# Patient Record
Sex: Female | Born: 1990
Health system: Southern US, Community
[De-identification: ages and names within clinical notes are randomized; demographics above are authoritative.]

## PROBLEM LIST (undated history)

## (undated) DIAGNOSIS — R51 Headache: Secondary | ICD-10-CM

## (undated) DIAGNOSIS — R519 Headache, unspecified: Secondary | ICD-10-CM

## (undated) DIAGNOSIS — Z789 Other specified health status: Secondary | ICD-10-CM

## (undated) DIAGNOSIS — E079 Disorder of thyroid, unspecified: Secondary | ICD-10-CM

## (undated) HISTORY — DX: Disorder of thyroid, unspecified: E07.9

## (undated) HISTORY — PX: TYMPANOSTOMY TUBE PLACEMENT: SHX32

---

## 2008-01-12 ENCOUNTER — Ambulatory Visit: Payer: Self-pay | Admitting: Internal Medicine

## 2008-01-31 ENCOUNTER — Ambulatory Visit: Payer: Self-pay | Admitting: Neurology

## 2013-03-21 LAB — HM PAP SMEAR: HM PAP: NEGATIVE

## 2013-11-19 ENCOUNTER — Observation Stay: Payer: Self-pay | Admitting: Obstetrics and Gynecology

## 2013-11-23 ENCOUNTER — Inpatient Hospital Stay: Payer: Self-pay

## 2013-11-23 LAB — CBC WITH DIFFERENTIAL/PLATELET
Basophil #: 0.1 10*3/uL (ref 0.0–0.1)
Basophil %: 0.6 %
EOS ABS: 0.1 10*3/uL (ref 0.0–0.7)
EOS PCT: 1 %
HCT: 33.1 % — AB (ref 35.0–47.0)
HGB: 11.1 g/dL — ABNORMAL LOW (ref 12.0–16.0)
LYMPHS ABS: 1.9 10*3/uL (ref 1.0–3.6)
Lymphocyte %: 18.2 %
MCH: 30.4 pg (ref 26.0–34.0)
MCHC: 33.4 g/dL (ref 32.0–36.0)
MCV: 91 fL (ref 80–100)
Monocyte #: 0.9 x10 3/mm (ref 0.2–0.9)
Monocyte %: 8.4 %
NEUTROS ABS: 7.6 10*3/uL — AB (ref 1.4–6.5)
Neutrophil %: 71.8 %
Platelet: 232 10*3/uL (ref 150–440)
RBC: 3.64 10*6/uL — ABNORMAL LOW (ref 3.80–5.20)
RDW: 14.2 % (ref 11.5–14.5)
WBC: 10.6 10*3/uL (ref 3.6–11.0)

## 2013-11-25 LAB — HEMATOCRIT: HCT: 28.5 % — ABNORMAL LOW (ref 35.0–47.0)

## 2014-06-08 ENCOUNTER — Ambulatory Visit
Admit: 2014-06-08 | Disposition: A | Discharge status: Home / Self Care | Payer: Self-pay | Attending: Otolaryngology | Admitting: Otolaryngology

## 2014-06-19 ENCOUNTER — Ambulatory Visit
Admit: 2014-06-19 | Disposition: A | Discharge status: Home / Self Care | Payer: Self-pay | Attending: Otolaryngology | Admitting: Otolaryngology

## 2014-06-27 ENCOUNTER — Other Ambulatory Visit: Payer: Self-pay | Admitting: Otolaryngology

## 2014-06-27 DIAGNOSIS — E041 Nontoxic single thyroid nodule: Secondary | ICD-10-CM

## 2014-06-28 ENCOUNTER — Other Ambulatory Visit: Payer: Self-pay | Admitting: Interventional Radiology

## 2014-06-28 ENCOUNTER — Telehealth: Payer: Self-pay | Admitting: *Deleted

## 2014-06-28 NOTE — Telephone Encounter (Signed)
Left pre-procedure phone call message.  To reschedule time of appt.

## 2014-06-29 ENCOUNTER — Ambulatory Visit
Admission: RE | Admit: 2014-06-29 | Discharge: 2014-06-29 | Disposition: A | Discharge status: Home / Self Care | Payer: 59 | Source: Ambulatory Visit | Attending: Otolaryngology | Admitting: Otolaryngology

## 2014-06-29 DIAGNOSIS — E041 Nontoxic single thyroid nodule: Secondary | ICD-10-CM | POA: Insufficient documentation

## 2014-06-29 NOTE — Discharge Instructions (Signed)
Thyroid Cyst The thyroid gland is a butterfly-shaped gland in the middle of the neck, located just below the voice box. It makes thyroid hormone. Thyroid hormone has an effect on nearly all tissues of your body by regulating your metabolism. Metabolism is the breakdown and use of food that you eat or energy that is stored in your body. Your metabolism affects your heart rate, blood pressure, body temperature, and weight. Thyroid cysts are enlarged fluid filled regions of the thyroid gland. These cysts range in size and may expand and enlarge suddenly. Rapidly expanding cysts may cause pain, difficulty swallowing, and rarely, difficulty breathing. Most cysts of the thyroid are not cancerous (benign). SYMPTOMS Bleeding may occur within the cyst. If the bleeding is severe, the cyst may get larger and produce problems in the neck, including swelling that may produce pain and difficultly swallowing. If the vocal cords are compressed, hoarseness may occur. If the windpipe is compressed, you may have difficulty breathing. DIAGNOSIS  A thyroid cyst is diagnosed through physical exam. The diagnosis can be confirmed by an ultrasound exam of the neck. This creates a picture by bouncing sound waves off the thyroid gland. Sometimes the cysts are drained using a fine needle. The fluid is then sent to the lab where it can be examined. This is done to see if any cells in the fluid are cancerous. If they are found to be cancerous, you will need further treatment.  TREATMENT  If the fluid in your neck does not show evidence of cancer, your caregiver may just want to monitor you with yearly ultrasound exams. Sometimes cysts need to be removed surgically. Document Released: 01/03/2004 Document Revised: 05/04/2011 Document Reviewed: 04/17/2010 [facility] Patient Information 2015 [place], [place]. This information is not intended to replace advice given to you by your health care provider. Make sure you discuss any questions you  have with your health care provider.

## 2014-07-03 NOTE — H&P (Signed)
L&D Evaluation:  History:  HPI 24yo MWF presents at [redacted]w[redacted]d for IOL, normal prenatal course to date.   Patient's Medical History No Chronic Illness   Patient's Surgical History none   Medications Pre Natal Vitamins   Allergies latex   Social History none   Family History Non-Contributory   ROS:  ROS All systems were reviewed.  HEENT, CNS, GI, GU, Respiratory, CV, Renal and Musculoskeletal systems were found to be normal.   Exam:  Vital Signs stable   General no apparent distress   Mental Status clear   Chest clear   Heart normal sinus rhythm   Abdomen gravid, non-tender   Estimated Fetal Weight Average for gestational age   Fetal Position vtx   Edema no edema   Pelvic no external lesions, 1/50/-2   Mebranes Intact   FHT normal rate with no decels   Ucx absent   Skin dry   Impression:  Impression IOL for postterm   Plan:  Plan EFM/NST, monitor contractions and for cervical change, antibiotics for GBBS prophylaxis, cytotec IOL   Electronic Signatures: Evonnie Pat (CNM)  (Signed 01-Oct-15 08:43)  Authored: L&D Evaluation   Last Updated: 01-Oct-15 08:43 by Evonnie Pat (CNM)

## 2014-07-17 ENCOUNTER — Encounter: Payer: Self-pay | Admitting: Otolaryngology

## 2014-07-20 ENCOUNTER — Inpatient Hospital Stay: Admission: RE | Admit: 2014-07-20 | Payer: 59 | Source: Ambulatory Visit

## 2014-07-20 ENCOUNTER — Encounter: Payer: Self-pay | Admitting: *Deleted

## 2014-07-20 DIAGNOSIS — Z91048 Other nonmedicinal substance allergy status: Secondary | ICD-10-CM | POA: Diagnosis not present

## 2014-07-20 DIAGNOSIS — Z79899 Other long term (current) drug therapy: Secondary | ICD-10-CM | POA: Diagnosis not present

## 2014-07-20 DIAGNOSIS — Z91040 Latex allergy status: Secondary | ICD-10-CM | POA: Diagnosis not present

## 2014-07-20 DIAGNOSIS — Z789 Other specified health status: Secondary | ICD-10-CM

## 2014-07-20 DIAGNOSIS — C73 Malignant neoplasm of thyroid gland: Secondary | ICD-10-CM | POA: Diagnosis not present

## 2014-07-20 DIAGNOSIS — E063 Autoimmune thyroiditis: Secondary | ICD-10-CM | POA: Diagnosis not present

## 2014-07-20 DIAGNOSIS — E041 Nontoxic single thyroid nodule: Secondary | ICD-10-CM | POA: Diagnosis present

## 2014-07-20 HISTORY — DX: Other specified health status: Z78.9

## 2014-07-20 NOTE — Patient Instructions (Signed)
  Your procedure is scheduled on: 08-06-14 Report to [facility] Desk 2nd Floor To find out your arrival time please call [phone] between 1PM - 3PM on 08-05-14  Remember: Instructions that are not followed completely may result in serious medical risk, up to and including death, or upon the discretion of your surgeon and anesthesiologist your surgery may need to be rescheduled.    _x___ 1. Do not eat food or drink liquids after midnight. No gum chewing or hard candies.     _x___ 2. No Alcohol for 24 hours before or after surgery.   ____ 3. Bring all medications with you on the day of surgery if instructed.    _x__ 4. Notify your doctor if there is any change in your medical condition     (cold, fever, infections).     Do not wear jewelry, make-up, hairpins, clips or nail polish.  Do not wear lotions, powders, or perfumes. You may wear deodorant.  Do not shave 48 hours prior to surgery. Men may shave face and neck.  Do not bring valuables to the hospital.    [facility] is not responsible for any belongings or valuables.               Contacts, dentures or bridgework may not be worn into surgery.  Leave your suitcase in the car. After surgery it may be brought to your room.  For patients admitted to the hospital, discharge time is determined by your                treatment team.   Patients discharged the day of surgery will not be allowed to drive home.   Please read over the following fact sheets that you were given:     __x__ Take these medicines the morning of surgery with A SIP OF WATER:    1.Reglan  2.   3.   4.  5.  6.  ____ Fleet Enema (as directed)   ____ Use CHG Soap as directed  ____ Use inhalers on the day of surgery  ____ Stop metformin 2 days prior to surgery    ____ Take 1/2 of usual insulin dose the night before surgery and none on the morning of surgery.   ____ Stop Coumadin/Plavix/aspirin N/A  ____ Stop Anti-inflammatories -NO  NSAIDS OR ASA PRODUCTS-TYLENOL OK   ____ Stop supplements until after surgery.    ____ Bring C-Pap to the hospital.

## 2014-08-06 ENCOUNTER — Ambulatory Visit: Payer: 59 | Admitting: Certified Registered Nurse Anesthetist

## 2014-08-06 ENCOUNTER — Encounter: Payer: Self-pay | Admitting: *Deleted

## 2014-08-06 ENCOUNTER — Encounter
Admission: RE | Disposition: A | Discharge status: Home / Self Care | Payer: Self-pay | Source: Ambulatory Visit | Attending: Otolaryngology

## 2014-08-06 ENCOUNTER — Observation Stay
Admission: RE | Admit: 2014-08-06 | Discharge: 2014-08-07 | Disposition: A | Discharge status: Home / Self Care | Payer: 59 | Source: Ambulatory Visit | Attending: Otolaryngology | Admitting: Otolaryngology

## 2014-08-06 DIAGNOSIS — Z91048 Other nonmedicinal substance allergy status: Secondary | ICD-10-CM | POA: Insufficient documentation

## 2014-08-06 DIAGNOSIS — C73 Malignant neoplasm of thyroid gland: Secondary | ICD-10-CM | POA: Diagnosis not present

## 2014-08-06 DIAGNOSIS — E890 Postprocedural hypothyroidism: Secondary | ICD-10-CM

## 2014-08-06 DIAGNOSIS — Z9089 Acquired absence of other organs: Secondary | ICD-10-CM

## 2014-08-06 DIAGNOSIS — Z79899 Other long term (current) drug therapy: Secondary | ICD-10-CM | POA: Insufficient documentation

## 2014-08-06 DIAGNOSIS — Z91040 Latex allergy status: Secondary | ICD-10-CM | POA: Insufficient documentation

## 2014-08-06 DIAGNOSIS — E063 Autoimmune thyroiditis: Secondary | ICD-10-CM | POA: Insufficient documentation

## 2014-08-06 HISTORY — PX: THYROIDECTOMY: SHX17

## 2014-08-06 HISTORY — DX: Headache: R51

## 2014-08-06 HISTORY — DX: Headache, unspecified: R51.9

## 2014-08-06 HISTORY — DX: Other specified health status: Z78.9

## 2014-08-06 LAB — POCT PREGNANCY, URINE: PREG TEST UR: NEGATIVE

## 2014-08-06 LAB — CALCIUM
Calcium: 8.3 mg/dL — ABNORMAL LOW (ref 8.9–10.3)
Calcium: 8.1 mg/dL — ABNORMAL LOW (ref 8.9–10.3)

## 2014-08-06 SURGERY — THYROIDECTOMY
Anesthesia: General

## 2014-08-06 MED ORDER — ONDANSETRON HCL 4 MG/2ML IJ SOLN: 4.0000 mg | Freq: Once | INTRAMUSCULAR | Status: DC | PRN

## 2014-08-06 MED ORDER — ONDANSETRON HCL 4 MG/2ML IJ SOLN
4.0000 mg | Freq: Four times a day (QID) | INTRAMUSCULAR | Status: DC | PRN
  Administered 2014-08-06: 4 mg via INTRAVENOUS
  Filled 2014-08-06 (×2): qty 2

## 2014-08-06 MED ORDER — FENTANYL CITRATE (PF) 100 MCG/2ML IJ SOLN
INTRAMUSCULAR | Status: AC
  Filled 2014-08-06: qty 2

## 2014-08-06 MED ORDER — LACTATED RINGERS IV SOLN
INTRAVENOUS | Status: DC
  Administered 2014-08-06 (×2): via INTRAVENOUS

## 2014-08-06 MED ORDER — CALCIUM CARBONATE ANTACID 500 MG PO CHEW
1000.0000 mg | CHEWABLE_TABLET | Freq: Three times a day (TID) | ORAL | Status: DC
  Administered 2014-08-06 – 2014-08-07 (×2): 1000 mg via ORAL
  Filled 2014-08-06 (×3): qty 5

## 2014-08-06 MED ORDER — BACITRACIN ZINC 500 UNIT/GM EX OINT
TOPICAL_OINTMENT | CUTANEOUS | Status: AC
Start: 2014-08-06 — End: 2014-08-06
  Filled 2014-08-06: qty 28.35

## 2014-08-06 MED ORDER — CALCIUM CARBONATE 1250 (500 CA) MG PO TABS
1000.0000 mg | ORAL_TABLET | Freq: Three times a day (TID) | ORAL | Status: DC
  Filled 2014-08-06 (×3): qty 2

## 2014-08-06 MED ORDER — SODIUM CHLORIDE 0.9 % IR SOLN
Status: DC | PRN
  Administered 2014-08-06: 200 mL

## 2014-08-06 MED ORDER — LIDOCAINE HCL (CARDIAC) 20 MG/ML IV SOLN
INTRAVENOUS | Status: DC | PRN
  Administered 2014-08-06: 100 mg via INTRAVENOUS

## 2014-08-06 MED ORDER — DEXTROSE-NACL 5-0.45 % IV SOLN
INTRAVENOUS | Status: DC
  Administered 2014-08-06: 17:00:00 via INTRAVENOUS

## 2014-08-06 MED ORDER — FAMOTIDINE 20 MG PO TABS
20.0000 mg | ORAL_TABLET | Freq: Once | ORAL | Status: AC
  Administered 2014-08-06: 20 mg via ORAL

## 2014-08-06 MED ORDER — FAMOTIDINE 20 MG PO TABS
ORAL_TABLET | ORAL | Status: AC
  Administered 2014-08-06: 20 mg via ORAL
  Filled 2014-08-06: qty 1

## 2014-08-06 MED ORDER — HYDROCODONE-ACETAMINOPHEN 7.5-325 MG/15ML PO SOLN
10.0000 mL | ORAL | Status: DC | PRN
  Administered 2014-08-06: 20 mL via ORAL
  Filled 2014-08-06: qty 30

## 2014-08-06 MED ORDER — METOCLOPRAMIDE HCL 10 MG PO TABS
10.0000 mg | ORAL_TABLET | Freq: Two times a day (BID) | ORAL | Status: DC
  Administered 2014-08-06: 10 mg via ORAL
  Filled 2014-08-06: qty 1

## 2014-08-06 MED ORDER — BUPIVACAINE-EPINEPHRINE (PF) 0.25% -1:200000 IJ SOLN
INTRAMUSCULAR | Status: DC | PRN
  Administered 2014-08-06: 8 mL

## 2014-08-06 MED ORDER — BUPIVACAINE-EPINEPHRINE (PF) 0.25% -1:200000 IJ SOLN
INTRAMUSCULAR | Status: AC
  Filled 2014-08-06: qty 30

## 2014-08-06 MED ORDER — ONDANSETRON HCL 4 MG/2ML IJ SOLN
INTRAMUSCULAR | Status: DC | PRN
  Administered 2014-08-06: 4 mg via INTRAVENOUS

## 2014-08-06 MED ORDER — SUCCINYLCHOLINE CHLORIDE 20 MG/ML IJ SOLN
INTRAMUSCULAR | Status: DC | PRN
  Administered 2014-08-06: 100 mg via INTRAVENOUS

## 2014-08-06 MED ORDER — CALCIUM CARBONATE ANTACID 500 MG PO CHEW
5.0000 | CHEWABLE_TABLET | Freq: Once | ORAL | Status: AC
  Administered 2014-08-06: 1000 mg via ORAL

## 2014-08-06 MED ORDER — PROPOFOL 10 MG/ML IV BOLUS
INTRAVENOUS | Status: DC | PRN
  Administered 2014-08-06: 140 mg via INTRAVENOUS

## 2014-08-06 MED ORDER — TRAMADOL HCL 50 MG PO TABS
50.0000 mg | ORAL_TABLET | Freq: Four times a day (QID) | ORAL | Status: DC | PRN
  Administered 2014-08-06: 50 mg via ORAL
  Filled 2014-08-06: qty 1

## 2014-08-06 MED ORDER — MORPHINE SULFATE 2 MG/ML IJ SOLN: 1.0000 mg | INTRAMUSCULAR | Status: DC | PRN

## 2014-08-06 MED ORDER — FENTANYL CITRATE (PF) 100 MCG/2ML IJ SOLN
INTRAMUSCULAR | Status: DC | PRN
  Administered 2014-08-06 (×2): 50 ug via INTRAVENOUS
  Administered 2014-08-06: 100 ug via INTRAVENOUS
  Administered 2014-08-06 (×2): 50 ug via INTRAVENOUS

## 2014-08-06 MED ORDER — MENTHOL 3 MG MT LOZG
1.0000 | LOZENGE | OROMUCOSAL | Status: DC | PRN
  Administered 2014-08-07: 3 mg via ORAL
  Filled 2014-08-06: qty 9

## 2014-08-06 MED ORDER — ACETAMINOPHEN 160 MG/5ML PO SOLN
650.0000 mg | Freq: Four times a day (QID) | ORAL | Status: DC | PRN
  Filled 2014-08-06: qty 20.3

## 2014-08-06 MED ORDER — MIDAZOLAM HCL 2 MG/2ML IJ SOLN
INTRAMUSCULAR | Status: DC | PRN
  Administered 2014-08-06: 2 mg via INTRAVENOUS

## 2014-08-06 MED ORDER — TRAMADOL 5 MG/ML ORAL SUSPENSION: 50.0000 mg | Freq: Four times a day (QID) | ORAL | Status: DC | PRN

## 2014-08-06 MED ORDER — FENTANYL CITRATE (PF) 100 MCG/2ML IJ SOLN
25.0000 ug | INTRAMUSCULAR | Status: DC | PRN
  Administered 2014-08-06 (×4): 25 ug via INTRAVENOUS

## 2014-08-06 SURGICAL SUPPLY — 36 items
BLADE SURG 15 STRL LF DISP TIS (BLADE) ×1 IMPLANT
BLADE SURG 15 STRL SS (BLADE) ×1
CANISTER SUCT 1200ML W/VALVE (MISCELLANEOUS) ×2 IMPLANT
CORD BIP STRL DISP 12FT (MISCELLANEOUS) ×2 IMPLANT
DRAIN TLS ROUND 10FR (DRAIN) IMPLANT
DRAPE MAG INST 16X20 L/F (DRAPES) ×2 IMPLANT
DRSG TEGADERM 2-3/8X2-3/4 SM (GAUZE/BANDAGES/DRESSINGS) IMPLANT
ELECT LARYNGEAL 6/7 (MISCELLANEOUS)
ELECT LARYNGEAL 8/9 (MISCELLANEOUS) ×4
ELECTRODE LARYNGEAL 6/7 (MISCELLANEOUS) IMPLANT
ELECTRODE LARYNGEAL 8/9 (MISCELLANEOUS) ×2 IMPLANT
FORCEPS JEWEL BIP 4-3/4 STR (INSTRUMENTS) ×2 IMPLANT
GLOVE BIO SURGEON STRL SZ7.5 (GLOVE) IMPLANT
GLOVE SURG NONLX 6.5 ULT (GLOVE) ×8 IMPLANT
GOWN STRL REUS W/ TWL LRG LVL3 (GOWN DISPOSABLE) ×3 IMPLANT
GOWN STRL REUS W/TWL LRG LVL3 (GOWN DISPOSABLE) ×3
HARMONIC SCALPEL FOCUS (MISCELLANEOUS) ×2 IMPLANT
HEMOSTAT SURGICEL 2X3 (HEMOSTASIS) ×2 IMPLANT
HOOK STAY 5M SHARP BLUNT 3316- (MISCELLANEOUS) ×2 IMPLANT
KIT RM TURNOVER STRD PROC AR (KITS) ×2 IMPLANT
LABEL OR SOLS (LABEL) ×2 IMPLANT
LIQUID BAND (GAUZE/BANDAGES/DRESSINGS) ×2 IMPLANT
NS IRRIG 500ML POUR BTL (IV SOLUTION) ×2 IMPLANT
PACK HEAD/NECK (MISCELLANEOUS) ×2 IMPLANT
PAD GROUND ADULT SPLIT (MISCELLANEOUS) ×2 IMPLANT
PROBE NEUROSIGN BIPOL (MISCELLANEOUS) ×1 IMPLANT
PROBE NEUROSIGN BIPOLAR (MISCELLANEOUS) ×1
SPONGE KITTNER 5P (MISCELLANEOUS) ×4 IMPLANT
SPONGE XRAY 4X4 16PLY STRL (MISCELLANEOUS) ×6 IMPLANT
STRIP CLOSURE SKIN 1/4X4 (GAUZE/BANDAGES/DRESSINGS) ×2 IMPLANT
SUT PROLENE 6 0 P 1 18 (SUTURE) ×2 IMPLANT
SUT SILK 2 0 (SUTURE) ×1
SUT SILK 2 0 SH (SUTURE) ×2 IMPLANT
SUT SILK 2-0 18XBRD TIE 12 (SUTURE) ×1 IMPLANT
SUT VIC AB 4-0 RB1 18 (SUTURE) ×2 IMPLANT
SYSTEM CHEST DRAIN TLS 7FR (DRAIN) IMPLANT

## 2014-08-06 NOTE — Transfer of Care (Signed)
Immediate Anesthesia Transfer of Care Note  Patient: Kristen Powers  Procedure(s) Performed: Procedure(s): THYROIDECTOMY (N/A)  Patient Location: PACU  Anesthesia Type:General  Level of Consciousness: awake and alert   Airway & Oxygen Therapy: Patient Spontanous Breathing and Patient connected to face mask oxygen  Post-op Assessment: Report given to RN and Post -op Vital signs reviewed and stable  Post vital signs: Reviewed and stable  Last Vitals:  Filed Vitals:   08/06/14 1020  BP: 141/83  Pulse: 109  Temp: 37.8 C  Resp: 18    Complications: No apparent anesthesia complications

## 2014-08-06 NOTE — Op Note (Signed)
..08/06/2014  10:14 AM    Kristen Powers  [id number]   Pre-Op Dx:  Thyroid Nodules suspicious on FNA  Post-op Dx: same  Proc: Minimally Invasive Total Thyroidectomy with Laryngeal Nerve Monitoring   Surg:  Kristen Powers   Assistant:  Kristen Powers  Anes:  GOT  EBL:  25 cc  Comp:  none  Findings:  Left superior and inferior parathyroid glands identified and preserved, right superior parathyroid identified and preserved.  Bilateral Recurrent laryngeal nerves identified and preserved.  Multinodular thyroid with multiple lymph nodes removed from anterior neck and right neck lateral to recurrent laryngeal nerve  Procedure: After the patient was identified in holding and the consent and H&P was reviewed, the patient was brought to the operating room and place in a supine position.  At this time, the patient was marked along a natural skin crease in the lower neck after general endotracheal anesthesia was induced.  Visualization of the nerve monitoring electrode on the endotracheal tube was made.  The patient at this time was injected with 8ccs of 0.25% Marcaine with 1:200,000 epinephrine.  At this time, the patient was prepped and draped in a sterile fashion.  An anterior neck incision with a 15 blade was made and dissection through the subcutaneous tissues was preformed with Bovie electrocautery.  The platysma was divided and the median raphe was sharply and bluntly dissected until the anterior isthmus of the thyroid was encountered.  At this time, the median raphe was opened until the sternal notch to the thyroid notch.  Anterior neck veins were divided with Harmonic Scalpel.    Attention at this time was directed to the patient's left thyroid lobe.  The sternohyoid and sternothyroid muscles were separated from the left thyroid lobe until the carotid artery and sheath were encountered.  The lateral border of the thyroid was bluntly and sharply dissected inferior and superior.   This demonstrated multiple lymph nodes medially in the area of the pyramidal lobe.  The lateral border of the superior thyroid vessels were identified and pedicled.  Medially between the larynx and the superior thyroid lobe, Kristen Powers space as bluntly entered resulting in a pedicled superior thyroid vessel bundle.  This was ligated  With Harmonic scalpel.  At this time, the superior attachments were divided and the thyroid lobe was able to be brought out of the wound and Berry's ligament was pedicled.  Careful dissection revealed the superior and inferior parathyroid glands as well as the recurrent laryngeal nerve coursing deep to the inferior thyroid artery.  This was identified and protected and the remaining attachments of the left hemithyroid were divided.  Attention at this time was directed to the patient's right thyroid lobe.  The sternohyoid and sternothyroid muscles were separated from the right thyroid lobe until the carotid artery and sheath were encountered.  The lateral border of the thyroid was bluntly and sharply dissected inferior and superior.  This demonstrated multiple lymph nodes medially in the area of the pyramidal lobe.  The lateral border of the superior thyroid vessels were identified and pedicled.  Medially between the larynx and the superior thyroid lobe, Kristen Powers space as bluntly entered resulting in a pedicled superior thyroid vessel bundle.  This was ligated  With Harmonic scalpel.  At this time, the superior attachments were divided and the thyroid lobe was able to be brought out of the wound and Berry's ligament was pedicled.  Careful dissection revealed the superior and inferior parathyroid glands as well as the recurrent laryngeal nerve coursing  deep to the inferior thyroid artery.  This was identified and protected and the remaining attachments of the right hemithyroid were divided.  At this time, all remaining attachments of the thyroid gland to the anterior trachea were divided.   Both recurrent laryngeal nerves were noted to be intact and stimulated well.  The left parathyroid glands were both evaluated and noted to be good in color.  The right superior parathyroid gland was identified and noted to be of normal color.  No interior parathyroid gland was noted in the specimen.  Several small lymph nodes were now removed from the patient's anterior neck.  Two 2 cm lymph nodes were removed from anterior trachea superior to the thyroid isthmus.  One 1.5cm lymph node was removed lateral to the right recurrent laryngeal nerve.  Dispo:   To PACU in good condition  Plan:  Admit to floor.  Follow calcium levels.  Hold on synthroid until pathology back.  Kristen Powers  08/06/2014 10:14 AM

## 2014-08-06 NOTE — H&P (Signed)
..  History and Physical paper copy reviewed and updated date of procedure and will be scanned into system.  

## 2014-08-06 NOTE — Anesthesia Preprocedure Evaluation (Signed)
Anesthesia Evaluation  Patient identified by MRN, date of birth, ID band Patient awake    Reviewed: Allergy & Precautions, NPO status   Airway Mallampati: I  TM Distance: >3 FB Neck ROM: Full    Dental  (+) Teeth Intact   Pulmonary    Pulmonary exam normal       Cardiovascular Exercise Tolerance: Good Normal cardiovascular exam    Neuro/Psych    GI/Hepatic   Endo/Other    Renal/GU      Musculoskeletal   Abdominal Normal abdominal exam  (+)   Peds  Hematology   Anesthesia Other Findings   Reproductive/Obstetrics (+) Breast feeding                              Anesthesia Physical Anesthesia Plan  ASA: II  Anesthesia Plan: General   Post-op Pain Management:    Induction: Intravenous  Airway Management Planned: Oral ETT  Additional Equipment:   Intra-op Plan:   Post-operative Plan: Extubation in OR  Informed Consent: I have reviewed the patients History and Physical, chart, labs and discussed the procedure including the risks, benefits and alternatives for the proposed anesthesia with the patient or authorized representative who has indicated his/her understanding and acceptance.     Plan Discussed with: CRNA  Anesthesia Plan Comments:         Anesthesia Quick Evaluation

## 2014-08-06 NOTE — Care Management Note (Signed)
Case Management Note  Patient Details  Name: TARRI GUILFOIL MRN: [id number] Date of Birth: 02-10-1991  Subjective/Objective:    24yo Ms Talita Recht was admitted today and received a total Thyroidectomy today. Case management will follow for discharge planning but do not anticipate any home health needs.                 Action/Plan:   Expected Discharge Date:                  Expected Discharge Plan:     In-House Referral:     Discharge planning Services     Post Acute Care Choice:    Choice offered to:     DME Arranged:    DME Agency:     HH Arranged:    [place] Agency:     Status of Service:     Medicare Important Message Given:    Date Medicare IM Given:    Medicare IM give by:    Date Additional Medicare IM Given:    Additional Medicare Important Message give by:     If discussed at [place] of Stay Meetings, dates discussed:    Additional Comments:  Blayne Garlick A, RN 08/06/2014, 3:55 PM

## 2014-08-06 NOTE — Anesthesia Postprocedure Evaluation (Signed)
  Anesthesia Post-op Note  Patient: Kristen Powers  Procedure(s) Performed: Procedure(s): THYROIDECTOMY (N/A)  Anesthesia type:General  Patient location: PACU  Post pain: Pain level controlled  Post assessment: Post-op Vital signs reviewed, Patient's Cardiovascular Status Stable, Respiratory Function Stable, Patent Airway and No signs of Nausea or vomiting  Post vital signs: Reviewed and stable  Last Vitals:  Filed Vitals:   08/06/14 1037  BP:   Pulse:   Temp: 38.1 C  Resp:     Level of consciousness: awake, alert  and patient cooperative  Complications: No apparent anesthesia complications

## 2014-08-07 DIAGNOSIS — C73 Malignant neoplasm of thyroid gland: Secondary | ICD-10-CM | POA: Diagnosis not present

## 2014-08-07 LAB — CALCIUM: Calcium: 8.2 mg/dL — ABNORMAL LOW (ref 8.9–10.3)

## 2014-08-07 MED ORDER — BACITRACIN 500 UNIT/GM EX OINT: 1.0000 "application " | TOPICAL_OINTMENT | Freq: Three times a day (TID) | CUTANEOUS | Status: DC

## 2014-08-07 MED ORDER — MENTHOL 3 MG MT LOZG: 1.0000 | LOZENGE | OROMUCOSAL | Status: DC | PRN

## 2014-08-07 MED ORDER — TRAMADOL HCL 50 MG PO TABS: 100.0000 mg | ORAL_TABLET | Freq: Four times a day (QID) | ORAL | Status: DC | PRN

## 2014-08-07 MED ORDER — PROMETHAZINE HCL 12.5 MG PO TABS: 12.5000 mg | ORAL_TABLET | Freq: Four times a day (QID) | ORAL | Status: DC | PRN

## 2014-08-07 MED ORDER — CALCIUM CARBONATE ANTACID 500 MG PO CHEW: 1000.0000 mg | CHEWABLE_TABLET | Freq: Three times a day (TID) | ORAL | Status: DC

## 2014-08-07 NOTE — Progress Notes (Signed)
Pt d/c to home today.  Husband at bedside for transport.  IV removed intact.  Pt d/c instructions reviewed and all questions and concerns addressed.  Rx printed and given to pt.  Medication and Procedural Education printed on D/C paperwork.  All questions and concerns regarding medications and d/c instructions reviewed.  Pt states understanding.

## 2014-08-07 NOTE — Progress Notes (Signed)
..   08/07/2014 7:33 AM  Lyla Son, Marye Round [id number]  Post-Op Day 1    Temp:  [97.3 F (36.3 C)-100.6 F (38.1 C)] 98.5 F (36.9 C) (06/14 0441) Pulse Rate:  [72-109] 76 (06/14 0441) Resp:  [16-18] 17 (06/14 0441) BP: (110-141)/(61-85) 110/61 mmHg (06/14 0441) SpO2:  [97 %-100 %] 99 % (06/14 0441),     Intake/Output Summary (Last 24 hours) at 08/07/14 0733 Last data filed at 08/07/14 4193  Gross per 24 hour  Intake   2323 ml  Output   1825 ml  Net    498 ml    Results for orders placed or performed during the hospital encounter of 08/06/14 (from the past 24 hour(s))  Calcium     Status: Abnormal   Collection Time: 08/06/14 11:19 AM  Result Value Ref Range   Calcium 8.3 (L) 8.9 - 10.3 mg/dL  Calcium     Status: Abnormal   Collection Time: 08/06/14  4:02 PM  Result Value Ref Range   Calcium 8.1 (L) 8.9 - 10.3 mg/dL  Calcium     Status: Abnormal   Collection Time: 08/07/14  4:27 AM  Result Value Ref Range   Calcium 8.2 (L) 8.9 - 10.3 mg/dL    SUBJECTIVE:  No acute events.  Some nausea yesterday with pain medications.  Switched to tramadol and improved nausea.  Still some pain with solids, but drinking fluids.  Ambulating to BR.  OBJECTIVE:  Gen- NAD, alert and responsive. Neck- incision c/d/i with minimal edema, no hematoma or seroma Neuro- neg chovsteks  IMPRESSION:  S/p Total thyroidectomy POD#1  PLAN:  Calciums increased this morning from yesterday afternoon.  Will discharge home on oral calcium supplementation.  Hold on synthroid until pathology back.  Isatu Macinnes 08/07/2014, 7:33 AM

## 2014-08-07 NOTE — Care Management (Signed)
Spoke with patient's husband. They deny need for RNCM. They both anticipate discharge today.

## 2014-08-08 LAB — SURGICAL PATHOLOGY

## 2014-08-21 ENCOUNTER — Ambulatory Visit: Payer: 59 | Admitting: Oncology

## 2014-08-22 ENCOUNTER — Inpatient Hospital Stay: Payer: 59 | Attending: Oncology | Admitting: Oncology

## 2014-08-22 ENCOUNTER — Encounter: Payer: Self-pay | Admitting: Oncology

## 2014-08-22 ENCOUNTER — Inpatient Hospital Stay: Payer: 59

## 2014-08-22 VITALS — BP 127/77 | HR 91 | Temp 97.5°F | Wt 180.0 lb

## 2014-08-22 DIAGNOSIS — C73 Malignant neoplasm of thyroid gland: Secondary | ICD-10-CM | POA: Diagnosis not present

## 2014-08-22 DIAGNOSIS — Z808 Family history of malignant neoplasm of other organs or systems: Secondary | ICD-10-CM | POA: Diagnosis not present

## 2014-08-22 DIAGNOSIS — Z79899 Other long term (current) drug therapy: Secondary | ICD-10-CM | POA: Diagnosis not present

## 2014-08-22 LAB — COMPREHENSIVE METABOLIC PANEL
ALBUMIN: 4.4 g/dL (ref 3.5–5.0)
ALK PHOS: 70 U/L (ref 38–126)
ALT: 11 U/L — AB (ref 14–54)
AST: 18 U/L (ref 15–41)
Anion gap: 5 (ref 5–15)
BILIRUBIN TOTAL: 0.5 mg/dL (ref 0.3–1.2)
BUN: 15 mg/dL (ref 6–20)
CO2: 26 mmol/L (ref 22–32)
Calcium: 8.6 mg/dL — ABNORMAL LOW (ref 8.9–10.3)
Chloride: 103 mmol/L (ref 101–111)
Creatinine, Ser: 0.82 mg/dL (ref 0.44–1.00)
GFR calc Af Amer: >60 mL/min (ref >60)
GFR calc non Af Amer: >60 mL/min (ref >60)
GLUCOSE: 82 mg/dL (ref 65–99)
POTASSIUM: 3.9 mmol/L (ref 3.5–5.1)
SODIUM: 134 mmol/L — AB (ref 135–145)
Total Protein: 7.3 g/dL (ref 6.5–8.1)

## 2014-08-22 LAB — CBC WITH DIFFERENTIAL/PLATELET
Basophils Absolute: 0.1 10*3/uL (ref 0–0.1)
Basophils Relative: 1 %
EOS ABS: 0.2 10*3/uL (ref 0–0.7)
Eosinophils Relative: 2 %
HEMATOCRIT: 41.8 % (ref 35.0–47.0)
Hemoglobin: 14.2 g/dL (ref 12.0–16.0)
LYMPHS PCT: 37 %
Lymphs Abs: 2.9 10*3/uL (ref 1.0–3.6)
MCH: 31.3 pg (ref 26.0–34.0)
MCHC: 33.9 g/dL (ref 32.0–36.0)
MCV: 92.5 fL (ref 80.0–100.0)
Monocytes Absolute: 0.7 10*3/uL (ref 0.2–0.9)
Monocytes Relative: 9 %
Neutro Abs: 4.1 10*3/uL (ref 1.4–6.5)
Neutrophils Relative %: 51 %
PLATELETS: 294 10*3/uL (ref 150–440)
RBC: 4.52 MIL/uL (ref 3.80–5.20)
RDW: 12.3 % (ref 11.5–14.5)
WBC: 8 10*3/uL (ref 3.6–11.0)

## 2014-08-22 LAB — TSH: TSH: 17.46 u[IU]/mL — ABNORMAL HIGH (ref 0.350–4.500)

## 2014-08-22 MED ORDER — LEVOTHYROXINE SODIUM 25 MCG PO TABS
25.0000 ug | ORAL_TABLET | Freq: Every day | ORAL | Status: DC
Start: 2014-08-22 — End: 2014-09-19

## 2014-08-22 NOTE — Progress Notes (Signed)
[place] @ [facility] [phone]  [phone]   [place] OB: 06-25-1990  MR#: [id number]  JAS#:[id number]  Patient Care Team: Evonnie Pat, CNM as PCP - General (Obstetrics and Gynecology)  CHIEF COMPLAINT:  Chief Complaint  Patient presents with  . New Evaluation    VISIT DIAGNOSIS:     ICD-9-CM ICD-10-CM   1. Thyroid cancer 193 C73 CBC with Differential     Comprehensive metabolic panel     T4     TSH     Thyroglobulin antibody     levothyroxine (SYNTHROID, LEVOTHROID) 25 MCG tablet     T4     TSH     T4     TSH     Thyroglobulin Level     CANCELED: Thyroglobulin antibody  2. Cancer of thyroid 193 C73      Oncology History   1.  Total thyroidectomy on August 06, 2014 for carcinoma of thyroid PE thyroid cancer with adjacent FNA site change.  Marked lymphocytic thyroiditis with nodular hyperplasia.  Benign reactive lymph node 3 lymph nodes are negative for malignancy.  Tumor size is 2.43 cm pT2 pN0 m0 tumor.     Cancer of thyroid   08/06/2014 Initial Diagnosis Cancer of thyroid    Oncology Flowsheet 08/06/2014 08/06/2014  metoCLOPramide (REGLAN) PO 10 mg    ondansetron (ZOFRAN) IV - 4 mg    INTERVAL HISTORY:   24 year old lady was found to have a large thyroid gland.  Patient underwent initial needle biopsy followed by afirma testing which was suspicious of patient underwent total thyroidectomy.  Was found to have papillary thyroid cancer.  Multiple lymph nodes which were removed were negative.  Total size of 2.43 cm.  Tumor was confined to the thyroid gland.  Was papillary thyroid cancer.  In for vascular invasion was not noticed.  He should not was referred to me for further evaluation and treatment consideration Patient has 6 months old baby and desires to go breast-feeding at least till  October. Patient desires to have another baby in next 2 years. Remains asymptomatic at present time   REVIEW OF SYSTEMS:     GENERAL:  Feels good.  Active.  No fevers, sweats or weight loss. PERFORMANCE STATUS (ECOG):  00 HEENT:  No visual changes, runny nose, sore throat, mouth sores or tenderness. Lungs: No shortness of breath or cough.  No hemoptysis. Cardiac:  No chest pain, palpitations, orthopnea, or PND. GI:  No nausea, vomiting, diarrhea, constipation, melena or hematochezia. GU:  No urgency, frequency, dysuria, or hematuria. Musculoskeletal:  No back pain.  No joint pain.  No muscle tenderness. Extremities:  No pain or swelling. Skin:  No rashes or skin changes. Neuro:  No headache, numbness or weakness, balance or coordination issues. Endocrine:  No diabetes, thyroid issues, hot flashes or night sweats. Psych:  No mood changes, depression or anxiety. Pain:  No focal pain. Review of systems:  All other systems reviewed and found to be negative.  As per HPI. Otherwise, a complete review of systems is negatve.  PAST MEDICAL HISTORY: Past Medical History  Diagnosis Date  . Headache   . Breastfeeding (infant) Jul 20, 2014    PAST SURGICAL HISTORY: Past Surgical History  Procedure Laterality Date  . Tympanostomy tube placement    . Thyroidectomy N/A 08/06/2014    Procedure: THYROIDECTOMY;  Surgeon: Carloyn Manner, MD;  Location: [facility] ORS;  Service: ENT;  Laterality: N/A;    FAMILY HISTORY  Mother had papillary thyroid cancer GYNECOLOGIC HISTORY: Premenopausal    ADVANCED DIRECTIVES: No advanced healthcare directive   HEALTH MAINTENANCE: History  Substance Use Topics  . Smoking status: Never Smoker   . Smokeless tobacco: Not on file  . Alcohol Use: No    panel:  Allergies  Allergen Reactions  . Latex Swelling and Rash  . Topamax [Topiramate]     Current Outpatient Prescriptions  Medication Sig Dispense Refill  . bacitracin 500 UNIT/GM ointment Apply 1 application topically 3 (three) times daily. 15 g 0  . calcium carbonate (TUMS - DOSED IN MG ELEMENTAL CALCIUM) 500 MG  chewable tablet Chew 5 tablets (1,000 mg of elemental calcium total) by mouth 3 (three) times daily with meals. 200 tablet 1  . metoCLOPramide (REGLAN) 10 MG tablet Take 10 mg by mouth 2 (two) times daily.    Marland Kitchen levothyroxine (SYNTHROID, LEVOTHROID) 25 MCG tablet Take 1 tablet (25 mcg total) by mouth daily before breakfast. 30 tablet 6  . menthol-cetylpyridinium (CEPACOL) 3 MG lozenge Take 1 lozenge (3 mg total) by mouth as needed for sore throat. (Patient not taking: Reported on 08/22/2014) 100 tablet 12  . promethazine (PHENERGAN) 12.5 MG tablet Take 1 tablet (12.5 mg total) by mouth every 6 (six) hours as needed for nausea or vomiting. (Patient not taking: Reported on 08/22/2014) 30 tablet 0  . traMADol (ULTRAM) 50 MG tablet Take 2 tablets (100 mg total) by mouth every 6 (six) hours as needed for moderate pain. (Patient not taking: Reported on 08/22/2014) 40 tablet 0   No current facility-administered medications for this visit.    OBJECTIVE: PHYSICAL EXAM: GENERAL:  Well developed, well nourished, sitting comfortably in the exam room in no acute distress. MENTAL STATUS:  Alert and oriented to person, place and time. HEAD:    Normocephalic, atraumatic, face symmetric, no Cushingoid features. Thyroid area: Recent surgery wound is healing well there is in duration and mild redness.  No palpable lymphadenopathy. EYES:.  Pupils equal round and reactive to light and accomodation.  No conjunctivitis or scleral icterus. ENT:  Oropharynx clear without lesion.  Tongue normal. Mucous membranes moist.  RESPIRATORY:  Clear to auscultation without rales, wheezes or rhonchi. CARDIOVASCULAR:  Regular rate and rhythm without murmur, rub or gallop. BREAST: Not examined . ABDOMEN:  Soft, non-tender, with active bowel sounds, and no hepatosplenomegaly.  No masses. BACK:  No CVA tenderness.  No tenderness on percussion of the back or rib cage. SKIN:  No rashes, ulcers or lesions. EXTREMITIES: No edema, no skin  discoloration or tenderness.  No palpable cords. LYMPH NODES: No palpable cervical, supraclavicular, axillary or inguinal adenopathy  NEUROLOGICAL: Unremarkable. PSYCH:  Appropriate.  Filed Vitals:   08/22/14 1354  BP: 127/77  Pulse: 91  Temp: 97.5 F (36.4 C)     Body mass index is 29.07 kg/(m^2).    ECOG FS:0 - Asymptomatic  LAB RESULTS:  Appointment on 08/22/2014  Component Date Value Ref Range Status  . WBC 08/22/2014 8.0  3.6 - 11.0 K/uL Final  . RBC 08/22/2014 4.52  3.80 - 5.20 MIL/uL Final  . Hemoglobin 08/22/2014 14.2  12.0 - 16.0 g/dL Final  . HCT 08/22/2014 41.8  35.0 - 47.0 % Final  . MCV 08/22/2014 92.5  80.0 - 100.0 fL Final  . MCH 08/22/2014 31.3  26.0 - 34.0 pg Final  . MCHC 08/22/2014 33.9  32.0 - 36.0 g/dL Final  . RDW 08/22/2014 12.3  11.5 - 14.5 % Final  . Platelets 08/22/2014  294  150 - 440 K/uL Final  . Neutrophils Relative % 08/22/2014 51   Final  . Neutro Abs 08/22/2014 4.1  1.4 - 6.5 K/uL Final  . Lymphocytes Relative 08/22/2014 37   Final  . Lymphs Abs 08/22/2014 2.9  1.0 - 3.6 K/uL Final  . Monocytes Relative 08/22/2014 9   Final  . Monocytes Absolute 08/22/2014 0.7  0.2 - 0.9 K/uL Final  . Eosinophils Relative 08/22/2014 2   Final  . Eosinophils Absolute 08/22/2014 0.2  0 - 0.7 K/uL Final  . Basophils Relative 08/22/2014 1   Final  . Basophils Absolute 08/22/2014 0.1  0 - 0.1 K/uL Final  . Sodium 08/22/2014 134* 135 - 145 mmol/L Final  . Potassium 08/22/2014 3.9  3.5 - 5.1 mmol/L Final  . Chloride 08/22/2014 103  101 - 111 mmol/L Final  . CO2 08/22/2014 26  22 - 32 mmol/L Final  . Glucose, Bld 08/22/2014 82  65 - 99 mg/dL Final  . BUN 08/22/2014 15  6 - 20 mg/dL Final  . Creatinine, Ser 08/22/2014 0.82  0.44 - 1.00 mg/dL Final  . Calcium 08/22/2014 8.6* 8.9 - 10.3 mg/dL Final  . Total Protein 08/22/2014 7.3  6.5 - 8.1 g/dL Final  . Albumin 08/22/2014 4.4  3.5 - 5.0 g/dL Final  . AST 08/22/2014 18  15 - 41 U/L Final  . ALT 08/22/2014 11* 14 -  54 U/L Final  . Alkaline Phosphatase 08/22/2014 70  38 - 126 U/L Final  . Total Bilirubin 08/22/2014 0.5  0.3 - 1.2 mg/dL Final  . GFR calc non Af Amer 08/22/2014 >60  >60 mL/min Final  . GFR calc Af Amer 08/22/2014 >60  >60 mL/min Final   Comment: (NOTE) The eGFR has been calculated using the CKD EPI equation. This calculation has not been validated in all clinical situations. eGFR's persistently <60 mL/min signify possible Chronic Kidney Disease.   . Anion gap 08/22/2014 5  5 - 15 Final     Ultrasound of the thyroid has been reviewed. ASSESSMENT:  Carcinoma of thyroid involving is Marcello Moores and right inferior lobe. Stage I disease T2 N0 M0 tumor Papillary thyroid carcinoma 2.  Patient has 65 months old child and she is still breast-feeding.  PLAN  I had prolonged discussion with patient.  Patient case was also reviewed in tumor conference. NCC and guidelines would suggest that in a younger patient with less than 4 cm tumor we can hold off radioactive ablation therapy Considering that patient desires to continue to breast-feed her child at this point in time fertility issue had been discussed with the patient.  Long-term side effect of radioactive iodine therapy.  We will consider switching patient to Synthroid monitoring thyroglobulin Possibility of radioactive iodine therapy after October Those issues that been discussed at length. The patient also has family history of thyroid cancer with mother having papillary thyroid cancer and possibility of genetic mutation involved will be also evaluated Recheck T4 TSH in 4 weeks after starting Synthroid Reevaluate patient in October for consideration of radioactive iodine ablation therapy  Patient expressed understanding and was in agreement with this plan. She also understands that She can call clinic at any time with any questions, concerns, or complaints.    Cancer of thyroid   Staging form: Thyroid - Papillary or Follicular (Under 45  years), AJCC 7th Edition     Clinical: Stage I (T2, N0, M0) - Signed by Forest Gleason, MD on 08/22/2014   Forest Gleason, MD  08/22/2014 5:02 PM

## 2014-08-22 NOTE — Progress Notes (Signed)
Patient her as referral from Dr. Pryor Ochoa for papillary thyroid cancer.  Total Thyroidectomy July 13. Patient does not have living will.  Never smoked.

## 2014-08-23 LAB — T4: T4 TOTAL: 2 ug/dL — AB (ref 4.5–12.0)

## 2014-08-23 LAB — THYROGLOBULIN ANTIBODY: THYROGLOBULIN ANTIBODY: 3.5 [IU]/mL — AB (ref 0.0–0.9)

## 2014-09-18 ENCOUNTER — Inpatient Hospital Stay: Payer: BLUE CROSS/BLUE SHIELD | Attending: Oncology

## 2014-09-18 DIAGNOSIS — C73 Malignant neoplasm of thyroid gland: Secondary | ICD-10-CM | POA: Diagnosis present

## 2014-09-18 LAB — TSH: TSH: 67.64 u[IU]/mL — AB (ref 0.350–4.500)

## 2014-09-19 ENCOUNTER — Other Ambulatory Visit: Payer: Self-pay | Admitting: *Deleted

## 2014-09-19 ENCOUNTER — Telehealth: Payer: Self-pay | Admitting: *Deleted

## 2014-09-19 DIAGNOSIS — C73 Malignant neoplasm of thyroid gland: Secondary | ICD-10-CM

## 2014-09-19 LAB — T4: T4, Total: 7.1 ug/dL (ref 4.5–12.0)

## 2014-09-19 MED ORDER — LEVOTHYROXINE SODIUM 75 MCG PO TABS: 75.0000 ug | ORAL_TABLET | Freq: Every day | ORAL | Status: DC

## 2014-09-19 NOTE — Telephone Encounter (Signed)
TSH is elevated and synthroid needs to be increased to 97mcg. New Rx has been escribed to pharmacy. Will recheck labs on 10/09/14.

## 2014-09-19 NOTE — Telephone Encounter (Signed)
Called and left message that TSH is elevated and MD wants pt to increase synthroid to 75 mcg.  A new prescription has been e-scribed to patient's pharmacy. Patient can call back if she has further questions or concerns.

## 2014-10-23 ENCOUNTER — Other Ambulatory Visit: Payer: BLUE CROSS/BLUE SHIELD

## 2014-10-24 ENCOUNTER — Other Ambulatory Visit: Payer: Self-pay

## 2014-10-25 LAB — TSH: TSH: 5.21 u[IU]/mL — ABNORMAL HIGH (ref 0.450–4.500)

## 2014-10-25 LAB — CALCIUM: CALCIUM: 9.9 mg/dL (ref 8.7–10.2)

## 2014-11-05 ENCOUNTER — Other Ambulatory Visit: Payer: Self-pay | Admitting: Obstetrics and Gynecology

## 2014-11-05 ENCOUNTER — Other Ambulatory Visit: Payer: Self-pay | Admitting: *Deleted

## 2014-11-05 MED ORDER — NITROFURANTOIN MONOHYD MACRO 100 MG PO CAPS: 100.0000 mg | ORAL_CAPSULE | Freq: Two times a day (BID) | ORAL | Status: DC

## 2014-11-06 ENCOUNTER — Telehealth: Payer: Self-pay | Admitting: *Deleted

## 2014-11-06 DIAGNOSIS — C73 Malignant neoplasm of thyroid gland: Secondary | ICD-10-CM

## 2014-11-06 MED ORDER — LEVOTHYROXINE SODIUM 150 MCG PO TABS: 150.0000 ug | ORAL_TABLET | Freq: Every day | ORAL | Status: DC

## 2014-11-06 NOTE — Telephone Encounter (Signed)
Her TSH is abn so he is adjusting her synthroid dose to 150 mcg daily. Wanted to update you

## 2014-11-09 LAB — URINE CULTURE

## 2014-11-16 ENCOUNTER — Other Ambulatory Visit: Payer: Self-pay | Admitting: Obstetrics and Gynecology

## 2014-12-11 ENCOUNTER — Inpatient Hospital Stay: Payer: BLUE CROSS/BLUE SHIELD | Attending: Oncology | Admitting: Oncology

## 2014-12-11 ENCOUNTER — Inpatient Hospital Stay: Payer: BLUE CROSS/BLUE SHIELD

## 2014-12-11 ENCOUNTER — Encounter: Payer: Self-pay | Admitting: Oncology

## 2014-12-11 VITALS — BP 141/80 | HR 83 | Temp 97.7°F | Wt 189.6 lb

## 2014-12-11 DIAGNOSIS — C73 Malignant neoplasm of thyroid gland: Secondary | ICD-10-CM | POA: Insufficient documentation

## 2014-12-11 DIAGNOSIS — Z79899 Other long term (current) drug therapy: Secondary | ICD-10-CM

## 2014-12-11 LAB — TSH: TSH: 0.095 u[IU]/mL — AB (ref 0.350–4.500)

## 2014-12-11 MED ORDER — LEVOTHYROXINE SODIUM 150 MCG PO TABS: 150.0000 ug | ORAL_TABLET | Freq: Every day | ORAL | Status: DC

## 2014-12-12 LAB — T4: T4, Total: 11 ug/dL (ref 4.5–12.0)

## 2014-12-13 ENCOUNTER — Other Ambulatory Visit: Payer: Self-pay | Admitting: Oncology

## 2014-12-13 ENCOUNTER — Other Ambulatory Visit: Payer: Self-pay | Admitting: *Deleted

## 2014-12-13 DIAGNOSIS — C73 Malignant neoplasm of thyroid gland: Secondary | ICD-10-CM

## 2014-12-14 ENCOUNTER — Other Ambulatory Visit: Payer: Self-pay | Admitting: *Deleted

## 2014-12-14 DIAGNOSIS — C73 Malignant neoplasm of thyroid gland: Secondary | ICD-10-CM

## 2014-12-15 LAB — THYROGLOBULIN LEVEL: THYROGLOBULIN: 2.6 ng/mL

## 2014-12-16 ENCOUNTER — Encounter: Payer: Self-pay | Admitting: Oncology

## 2014-12-16 NOTE — Progress Notes (Signed)
[place] @ [facility] [phone]  [phone]   [place] OB: Jul 24, 1990  MR#: [id number]  ZDG#:[id number]  Patient Care Team: Evonnie Pat, CNM as PCP - General (Obstetrics and Gynecology)  CHIEF COMPLAINT:  Chief Complaint  Patient presents with  . OTHER    VISIT DIAGNOSIS:     ICD-9-CM ICD-10-CM   1. Cancer of thyroid ([place]) 193 C73 levothyroxine (SYNTHROID, LEVOTHROID) 150 MCG tablet     Oncology History   1.  Total thyroidectomy on August 06, 2014 for carcinoma of thyroid PE thyroid cancer with adjacent FNA site change.  Marked lymphocytic thyroiditis with nodular hyperplasia.  Benign reactive lymph node 3 lymph nodes are negative for malignancy.  Tumor size is 2.43 cm pT2 pN0 m0 tumor.   2.  Patient was referred to nuclear medicine department for radioactive iodine ablation therapy (October, 2016)   Oncology Flowsheet 08/06/2014 08/06/2014  metoCLOPramide (REGLAN) PO 10 mg    ondansetron (ZOFRAN) IV - 4 mg    INTERVAL HISTORY:   24 year old lady with a history of carcinoma of thyroid came today further follow-up. Patient has stopped breast-feeding at present time Patient is fully aware that while getting radioactive iodine ablation therapy she is not supposed to be pregnant and will observe proper birth control means Here for further follow-up and treatment consideration    REVIEW OF SYSTEMS:   GENERAL:  Feels good.  Active.  No fevers, sweats or weight loss. PERFORMANCE STATUS (ECOG):  00 HEENT:  No visual changes, runny nose, sore throat, mouth sores or tenderness. Lungs: No shortness of breath or cough.  No hemoptysis. Cardiac:  No chest pain, palpitations, orthopnea, or PND. GI:  No nausea, vomiting, diarrhea, constipation, melena or hematochezia. GU:  No urgency, frequency, dysuria, or hematuria. Musculoskeletal:  No back pain.  No joint pain.  No muscle tenderness. Extremities:  No pain or swelling. Skin:  No  rashes or skin changes. Neuro:  No headache, numbness or weakness, balance or coordination issues. Endocrine:  No diabetes, thyroid issues, hot flashes or night sweats. Psych:  No mood changes, depression or anxiety. Pain:  No focal pain. Review of systems:  All other systems reviewed and found to be negative.  As per HPI. Otherwise, a complete review of systems is negatve.  PAST MEDICAL HISTORY: Past Medical History  Diagnosis Date  . Headache   . Breastfeeding (infant) Jul 20, 2014    PAST SURGICAL HISTORY: Past Surgical History  Procedure Laterality Date  . Tympanostomy tube placement    . Thyroidectomy N/A 08/06/2014    Procedure: THYROIDECTOMY;  Surgeon: Carloyn Manner, MD;  Location: [facility] ORS;  Service: ENT;  Laterality: N/A;    FAMILY HISTORY Mother had papillary thyroid cancer GYNECOLOGIC HISTORY: Premenopausal    ADVANCED DIRECTIVES: No advanced healthcare directive   HEALTH MAINTENANCE: Social History  Substance Use Topics  . Smoking status: Never Smoker   . Smokeless tobacco: None  . Alcohol Use: No    panel:  Allergies  Allergen Reactions  . Latex Swelling and Rash  . Topamax [Topiramate]     Current Outpatient Prescriptions  Medication Sig Dispense Refill  . levothyroxine (SYNTHROID, LEVOTHROID) 150 MCG tablet Take 1 tablet (150 mcg total) by mouth daily before breakfast. 30 tablet 3   No current facility-administered medications for this visit.    OBJECTIVE: PHYSICAL EXAM: GENERAL:  Well developed, well nourished, sitting comfortably in the exam room in no acute distress. MENTAL STATUS:  Alert and  oriented to person, place and time. HEAD:    Normocephalic, atraumatic, face symmetric, no Cushingoid features. Thyroid area: Recent surgery wound is healing well there is in duration and mild redness.  No palpable lymphadenopathy. EYES:.  Pupils equal round and reactive to light and accomodation.  No conjunctivitis or scleral icterus. ENT:   Oropharynx clear without lesion.  Tongue normal. Mucous membranes moist.  RESPIRATORY:  Clear to auscultation without rales, wheezes or rhonchi. CARDIOVASCULAR:  Regular rate and rhythm without murmur, rub or gallop. BREAST: Not examined . ABDOMEN:  Soft, non-tender, with active bowel sounds, and no hepatosplenomegaly.  No masses. BACK:  No CVA tenderness.  No tenderness on percussion of the back or rib cage. SKIN:  No rashes, ulcers or lesions. EXTREMITIES: No edema, no skin discoloration or tenderness.  No palpable cords. LYMPH NODES: No palpable cervical, supraclavicular, axillary or inguinal adenopathy  NEUROLOGICAL: Unremarkable. PSYCH:  Appropriate.  Filed Vitals:   12/11/14 1457  BP: 141/80  Pulse: 83  Temp: 97.7 F (36.5 C)     Body mass index is 30.62 kg/(m^2).    ECOG FS:0 - Asymptomatic  LAB RESULTS:  Appointment on 12/11/2014  Component Date Value Ref Range Status  . T4, Total 12/11/2014 11.0  4.5 - 12.0 ug/dL Final   Comment: (NOTE) Performed At: [facility] [place], [place] [id number] Lindon Romp MD [id number]   . TSH 12/11/2014 0.095* 0.350 - 4.500 uIU/mL Final  . Thyroglobulin 12/11/2014 2.6   Final   Comment: (NOTE) Reference Range: Pubertal Children and Adults: <40 According to the [facility] of Clinical Biochemistry, the reference interval for Thyroglobulin (TG) should be related to euthyroid patients and not for patients who underwent thyroidectomy.  TG reference intervals for these patients depend on the residual mass of the thyroid tissue left after surgery.  Establishing a post-operative baseline is recommended.  The assay quantitation limit is 2.0 ng/mL. Performed At: [facility] [place], [place] [id number] Pepkowitz Sheral Apley MD [id number]      Ultrasound of the thyroid has been reviewed. ASSESSMENT:  Carcinoma of thyroid involving is Marcello Moores and right inferior  lobe. Stage I disease T2 N0 M0 tumor Papillary thyroid carcinoma 2.  Patient has 6 months old child and she is still breast-feeding.  PLAN  Proceed with radioactive iodine ablation therapy It will be Thyrogen stimulated radioactive iodine ablation treatment Patient will be then followed regularly with thyroglobulin assessment and thyroid functions will be monitored  Patient expressed understanding and was in agreement with this plan. She also understands that She can call clinic at any time with any questions, concerns, or complaints.    Cancer of thyroid   Staging form: Thyroid - Papillary or Follicular (Under 45 years), AJCC 7th Edition     Clinical: Stage I (T2, N0, M0) - Signed by Forest Gleason, MD on 08/22/2014   Forest Gleason, MD   12/16/2014 9:14 PM

## 2014-12-18 ENCOUNTER — Other Ambulatory Visit: Payer: Self-pay | Admitting: *Deleted

## 2014-12-18 DIAGNOSIS — C73 Malignant neoplasm of thyroid gland: Secondary | ICD-10-CM

## 2014-12-28 ENCOUNTER — Inpatient Hospital Stay: Payer: BLUE CROSS/BLUE SHIELD | Attending: Oncology

## 2014-12-28 DIAGNOSIS — C73 Malignant neoplasm of thyroid gland: Secondary | ICD-10-CM

## 2014-12-28 LAB — HCG, QUANTITATIVE, PREGNANCY: hCG, Beta Chain, Quant, S: <1 m[IU]/mL (ref <5)

## 2014-12-31 ENCOUNTER — Encounter
Admission: RE | Admit: 2014-12-31 | Discharge: 2014-12-31 | Disposition: A | Discharge status: Home / Self Care | Payer: BLUE CROSS/BLUE SHIELD | Source: Ambulatory Visit | Attending: Oncology | Admitting: Oncology

## 2014-12-31 DIAGNOSIS — C73 Malignant neoplasm of thyroid gland: Secondary | ICD-10-CM | POA: Diagnosis not present

## 2014-12-31 MED ORDER — THYROTROPIN ALFA 1.1 MG IM SOLR
0.9000 mg | INTRAMUSCULAR | Status: AC
Start: 2014-12-31 — End: 2014-12-31
  Administered 2014-12-31: 0.9 mg via INTRAMUSCULAR
  Filled 2014-12-31: qty 0.9

## 2015-01-01 ENCOUNTER — Encounter
Admission: RE | Admit: 2015-01-01 | Discharge: 2015-01-01 | Disposition: A | Discharge status: Home / Self Care | Payer: BLUE CROSS/BLUE SHIELD | Source: Ambulatory Visit | Attending: Oncology | Admitting: Oncology

## 2015-01-01 DIAGNOSIS — C73 Malignant neoplasm of thyroid gland: Secondary | ICD-10-CM | POA: Diagnosis not present

## 2015-01-01 MED ORDER — THYROTROPIN ALFA 1.1 MG IM SOLR
0.9000 mg | INTRAMUSCULAR | Status: AC
  Administered 2015-01-01: 0.9 mg via INTRAMUSCULAR
  Filled 2015-01-01: qty 0.9

## 2015-01-02 ENCOUNTER — Encounter
Admission: RE | Admit: 2015-01-02 | Discharge: 2015-01-02 | Disposition: A | Discharge status: Home / Self Care | Payer: BLUE CROSS/BLUE SHIELD | Source: Ambulatory Visit | Attending: Oncology | Admitting: Oncology

## 2015-01-02 DIAGNOSIS — C73 Malignant neoplasm of thyroid gland: Secondary | ICD-10-CM | POA: Diagnosis not present

## 2015-01-02 MED ORDER — SODIUM IODIDE I 131 CAPSULE
45.2400 | Freq: Once | INTRAVENOUS | Status: AC | PRN
  Administered 2015-01-02: 45.24 via ORAL

## 2015-01-07 ENCOUNTER — Telehealth: Payer: Self-pay | Admitting: Obstetrics and Gynecology

## 2015-01-07 NOTE — Telephone Encounter (Signed)
Pt called and she has had the radioactive iodine and her doctor told her not to get pregnant for a year, and she stopped the depo due to weight gain so she wanted to know if you can call in [facility] pills for her.

## 2015-01-08 ENCOUNTER — Other Ambulatory Visit: Payer: Self-pay | Admitting: *Deleted

## 2015-01-08 MED ORDER — NORETHIN-ETH ESTRAD-FE BIPHAS 1 MG-10 MCG / 10 MCG PO TABS: 1.0000 | ORAL_TABLET | Freq: Every day | ORAL | Status: DC

## 2015-01-08 NOTE — Telephone Encounter (Signed)
Yes send in rx for loloestrin

## 2015-01-08 NOTE — Telephone Encounter (Signed)
Done-ac 

## 2015-01-11 ENCOUNTER — Ambulatory Visit
Admission: RE | Admit: 2015-01-11 | Discharge: 2015-01-11 | Disposition: A | Discharge status: Home / Self Care | Payer: BLUE CROSS/BLUE SHIELD | Source: Ambulatory Visit | Attending: Oncology | Admitting: Oncology

## 2015-01-11 DIAGNOSIS — Z9889 Other specified postprocedural states: Secondary | ICD-10-CM | POA: Insufficient documentation

## 2015-01-11 DIAGNOSIS — C73 Malignant neoplasm of thyroid gland: Secondary | ICD-10-CM | POA: Insufficient documentation

## 2015-02-11 ENCOUNTER — Other Ambulatory Visit: Payer: Self-pay | Admitting: *Deleted

## 2015-02-11 MED ORDER — AMOXICILLIN 500 MG PO CAPS: 500.0000 mg | ORAL_CAPSULE | Freq: Two times a day (BID) | ORAL | Status: DC

## 2015-02-12 ENCOUNTER — Inpatient Hospital Stay: Payer: BLUE CROSS/BLUE SHIELD | Attending: Oncology

## 2015-02-12 ENCOUNTER — Inpatient Hospital Stay (HOSPITAL_BASED_OUTPATIENT_CLINIC_OR_DEPARTMENT_OTHER): Payer: BLUE CROSS/BLUE SHIELD | Admitting: Oncology

## 2015-02-12 ENCOUNTER — Encounter: Payer: Self-pay | Admitting: Oncology

## 2015-02-12 VITALS — BP 117/81 | HR 82 | Temp 97.4°F | Resp 18 | Wt 193.6 lb

## 2015-02-12 DIAGNOSIS — Z923 Personal history of irradiation: Secondary | ICD-10-CM | POA: Insufficient documentation

## 2015-02-12 DIAGNOSIS — C73 Malignant neoplasm of thyroid gland: Secondary | ICD-10-CM

## 2015-02-12 DIAGNOSIS — Z79899 Other long term (current) drug therapy: Secondary | ICD-10-CM | POA: Insufficient documentation

## 2015-02-12 DIAGNOSIS — J00 Acute nasopharyngitis [common cold]: Secondary | ICD-10-CM

## 2015-02-12 DIAGNOSIS — R05 Cough: Secondary | ICD-10-CM | POA: Diagnosis not present

## 2015-02-12 DIAGNOSIS — Z85850 Personal history of malignant neoplasm of thyroid: Secondary | ICD-10-CM | POA: Insufficient documentation

## 2015-02-12 LAB — CBC WITH DIFFERENTIAL/PLATELET
BASOS ABS: 0.1 10*3/uL (ref 0–0.1)
Basophils Relative: 1 %
EOS ABS: 0.2 10*3/uL (ref 0–0.7)
Eosinophils Relative: 4 %
HCT: 43 % (ref 35.0–47.0)
Hemoglobin: 14.7 g/dL (ref 12.0–16.0)
LYMPHS PCT: 31 %
Lymphs Abs: 1.9 10*3/uL (ref 1.0–3.6)
MCH: 31 pg (ref 26.0–34.0)
MCHC: 34.2 g/dL (ref 32.0–36.0)
MCV: 90.6 fL (ref 80.0–100.0)
Monocytes Absolute: 1 10*3/uL — ABNORMAL HIGH (ref 0.2–0.9)
Monocytes Relative: 16 %
Neutro Abs: 2.8 10*3/uL (ref 1.4–6.5)
Neutrophils Relative %: 48 %
PLATELETS: 250 10*3/uL (ref 150–440)
RBC: 4.75 MIL/uL (ref 3.80–5.20)
RDW: 13.1 % (ref 11.5–14.5)
WBC: 5.9 10*3/uL (ref 3.6–11.0)

## 2015-02-12 LAB — COMPREHENSIVE METABOLIC PANEL
ALBUMIN: 3.9 g/dL (ref 3.5–5.0)
ALT: 12 U/L — AB (ref 14–54)
AST: 17 U/L (ref 15–41)
Alkaline Phosphatase: 64 U/L (ref 38–126)
Anion gap: 6 (ref 5–15)
BUN: 9 mg/dL (ref 6–20)
CHLORIDE: 104 mmol/L (ref 101–111)
CO2: 25 mmol/L (ref 22–32)
CREATININE: 0.66 mg/dL (ref 0.44–1.00)
Calcium: 8.4 mg/dL — ABNORMAL LOW (ref 8.9–10.3)
GFR calc Af Amer: >60 mL/min (ref >60)
GFR calc non Af Amer: >60 mL/min (ref >60)
Glucose, Bld: 107 mg/dL — ABNORMAL HIGH (ref 65–99)
Potassium: 3.6 mmol/L (ref 3.5–5.1)
SODIUM: 135 mmol/L (ref 135–145)
Total Bilirubin: 0.2 mg/dL — ABNORMAL LOW (ref 0.3–1.2)
Total Protein: 7.3 g/dL (ref 6.5–8.1)

## 2015-02-12 LAB — TSH: TSH: 0.061 u[IU]/mL — ABNORMAL LOW (ref 0.350–4.500)

## 2015-02-17 LAB — T4: T4 TOTAL: 13.2 ug/dL — AB (ref 4.5–12.0)

## 2015-02-17 LAB — THYROGLOBULIN LEVEL

## 2015-02-18 ENCOUNTER — Encounter: Payer: Self-pay | Admitting: Oncology

## 2015-02-18 NOTE — Progress Notes (Signed)
[place] @ [facility] [phone]  Fax:([phone] [place]: 1990-08-24  MR#: [id number]  XBM#:[id number]  Patient Care Team: Melody Rockney Ghee, CNM as PCP - General (Obstetrics and Gynecology)  CHIEF COMPLAINT:  Chief Complaint  Patient presents with  . Thyroid Cancer    VISIT DIAGNOSIS:     ICD-9-CM ICD-10-CM   1. Cancer of thyroid ([place]) 193 C73 T4     TSH     Thyroglobulin Level     Oncology History   1.  Total thyroidectomy on August 06, 2014 for carcinoma of thyroid PE thyroid cancer with adjacent FNA site change.  Marked lymphocytic thyroiditis with nodular hyperplasia.  Benign reactive lymph node 3 lymph nodes are negative for malignancy.  Tumor size is 2.43 cm pT2 pN0 m0 tumor.   2.  Patient was referred to nuclear medicine department for radioactive iodine ablation therapy (October, 2016)   Oncology Flowsheet 08/06/2014 08/06/2014  metoCLOPramide (REGLAN) PO 10 mg    ondansetron (ZOFRAN) IV - 4 mg    INTERVAL HISTORY:   24 year old lady with a history of carcinoma of thyroid came today further follow-up. Patient has stopped breast-feeding at present time Patient is here for further follow-up and treatment consideration. On 150 g off Synthroid.  Complaining of cough and cold.     REVIEW OF SYSTEMS:   GENERAL:  Feels good.  Active.  No fevers, sweats or weight loss. PERFORMANCE STATUS (ECOG):  00 HEENT:  No visual changes, runny nose, sore throat, mouth sores or tenderness. Lungs: No shortness of breath or cough.  No hemoptysis. Cardiac:  No chest pain, palpitations, orthopnea, or PND. GI:  No nausea, vomiting, diarrhea, constipation, melena or hematochezia. GU:  No urgency, frequency, dysuria, or hematuria. Musculoskeletal:  No back pain.  No joint pain.  No muscle tenderness. Extremities:  No pain or swelling. Skin:  No rashes or skin changes. Neuro:  No headache, numbness or weakness, balance or  coordination issues. Endocrine:  No diabetes, thyroid issues, hot flashes or night sweats. Psych:  No mood changes, depression or anxiety. Pain:  No focal pain. Review of systems:  All other systems reviewed and found to be negative.  As per HPI. Otherwise, a complete review of systems is negatve.  PAST MEDICAL HISTORY: Past Medical History  Diagnosis Date  . Headache   . Breastfeeding (infant) Jul 20, 2014    PAST SURGICAL HISTORY: Past Surgical History  Procedure Laterality Date  . Tympanostomy tube placement    . Thyroidectomy N/A 08/06/2014    Procedure: THYROIDECTOMY;  Surgeon: Carloyn Manner, MD;  Location: [facility] ORS;  Service: ENT;  Laterality: N/A;    FAMILY HISTORY Mother had papillary thyroid cancer GYNECOLOGIC HISTORY: Premenopausal    ADVANCED DIRECTIVES: No advanced healthcare directive   HEALTH MAINTENANCE: Social History  Substance Use Topics  . Smoking status: Never Smoker   . Smokeless tobacco: None  . Alcohol Use: No    panel:  Allergies  Allergen Reactions  . Latex Swelling and Rash  . Topamax [Topiramate]     Current Outpatient Prescriptions  Medication Sig Dispense Refill  . amoxicillin (AMOXIL) 500 MG capsule Take 1 capsule (500 mg total) by mouth 2 (two) times daily. 14 capsule 1  . levothyroxine (SYNTHROID, LEVOTHROID) 150 MCG tablet Take 1 tablet (150 mcg total) by mouth daily before breakfast. 30 tablet 3  . Norethindrone-Ethinyl Estradiol-Fe Biphas (LO LOESTRIN FE) 1 MG-10 MCG / 10 MCG tablet Take 1 tablet  by mouth daily. 1 Package 11   No current facility-administered medications for this visit.    OBJECTIVE: PHYSICAL EXAM: GENERAL:  Well developed, well nourished, sitting comfortably in the exam room in no acute distress. MENTAL STATUS:  Alert and oriented to person, place and time. HEAD:    Normocephalic, atraumatic, face symmetric, no Cushingoid features. Thyroid area: Recent surgery wound is healing well there is in duration  and mild redness.  No palpable lymphadenopathy. EYES:.  Pupils equal round and reactive to light and accomodation.  No conjunctivitis or scleral icterus. ENT:  Oropharynx clear without lesion.  Tongue normal. Mucous membranes moist.  RESPIRATORY:  Clear to auscultation without rales, wheezes or rhonchi. CARDIOVASCULAR:  Regular rate and rhythm without murmur, rub or gallop. BREAST: Not examined . ABDOMEN:  Soft, non-tender, with active bowel sounds, and no hepatosplenomegaly.  No masses. BACK:  No CVA tenderness.  No tenderness on percussion of the back or rib cage. SKIN:  No rashes, ulcers or lesions. EXTREMITIES: No edema, no skin discoloration or tenderness.  No palpable cords. LYMPH NODES: No palpable cervical, supraclavicular, axillary or inguinal adenopathy  NEUROLOGICAL: Unremarkable. PSYCH:  Appropriate.  Filed Vitals:   02/12/15 1536  BP: 117/81  Pulse: 82  Temp: 97.4 F (36.3 C)  Resp: 18     Body mass index is 31.26 kg/(m^2).    ECOG FS:0 - Asymptomatic  LAB RESULTS:  Appointment on 02/12/2015  Component Date Value Ref Range Status  . WBC 02/12/2015 5.9  3.6 - 11.0 K/uL Final  . RBC 02/12/2015 4.75  3.80 - 5.20 MIL/uL Final  . Hemoglobin 02/12/2015 14.7  12.0 - 16.0 g/dL Final  . HCT 02/12/2015 43.0  35.0 - 47.0 % Final  . MCV 02/12/2015 90.6  80.0 - 100.0 fL Final  . MCH 02/12/2015 31.0  26.0 - 34.0 pg Final  . MCHC 02/12/2015 34.2  32.0 - 36.0 g/dL Final  . RDW 02/12/2015 13.1  11.5 - 14.5 % Final  . Platelets 02/12/2015 250  150 - 440 K/uL Final  . Neutrophils Relative % 02/12/2015 48   Final  . Neutro Abs 02/12/2015 2.8  1.4 - 6.5 K/uL Final  . Lymphocytes Relative 02/12/2015 31   Final  . Lymphs Abs 02/12/2015 1.9  1.0 - 3.6 K/uL Final  . Monocytes Relative 02/12/2015 16   Final  . Monocytes Absolute 02/12/2015 1.0* 0.2 - 0.9 K/uL Final  . Eosinophils Relative 02/12/2015 4   Final  . Eosinophils Absolute 02/12/2015 0.2  0 - 0.7 K/uL Final  . Basophils  Relative 02/12/2015 1   Final  . Basophils Absolute 02/12/2015 0.1  0 - 0.1 K/uL Final  . Sodium 02/12/2015 135  135 - 145 mmol/L Final  . Potassium 02/12/2015 3.6  3.5 - 5.1 mmol/L Final  . Chloride 02/12/2015 104  101 - 111 mmol/L Final  . CO2 02/12/2015 25  22 - 32 mmol/L Final  . Glucose, Bld 02/12/2015 107* 65 - 99 mg/dL Final  . BUN 02/12/2015 9  6 - 20 mg/dL Final  . Creatinine, Ser 02/12/2015 0.66  0.44 - 1.00 mg/dL Final  . Calcium 02/12/2015 8.4* 8.9 - 10.3 mg/dL Final  . Total Protein 02/12/2015 7.3  6.5 - 8.1 g/dL Final  . Albumin 02/12/2015 3.9  3.5 - 5.0 g/dL Final  . AST 02/12/2015 17  15 - 41 U/L Final  . ALT 02/12/2015 12* 14 - 54 U/L Final  . Alkaline Phosphatase 02/12/2015 64  38 - 126 U/L  Final  . Total Bilirubin 02/12/2015 0.2* 0.3 - 1.2 mg/dL Final  . GFR calc non Af Amer 02/12/2015 >60  >60 mL/min Final  . GFR calc Af Amer 02/12/2015 >60  >60 mL/min Final   Comment: (NOTE) The eGFR has been calculated using the CKD EPI equation. This calculation has not been validated in all clinical situations. eGFR's persistently <60 mL/min signify possible Chronic Kidney Disease.   . Anion gap 02/12/2015 6  5 - 15 Final  . T4, Total 02/12/2015 13.2* 4.5 - 12.0 ug/dL Final   Comment: (NOTE) Performed At: [facility] [place], [place] [id number] Lindon Romp MD UX:[id number]   . TSH 02/12/2015 0.061* 0.350 - 4.500 uIU/mL Final  . Thyroglobulin 02/12/2015 <2.0   Final   Comment: (NOTE) Reference Range: Pubertal Children and Adults: <40 According to the [place] of Clinical Biochemistry, the reference interval for Thyroglobulin (TG) should be related to euthyroid patients and not for patients who underwent thyroidectomy.  TG reference intervals for these patients depend on the residual mass of the thyroid tissue left after surgery.  Establishing a post-operative baseline is recommended.  The assay quantitation limit is 2.0  ng/mL. Performed At: [facility] [place], [place] [id number] Pepkowitz Sheral Apley MD UR:[id number]       ASSESSMENT:  Carcinoma of thyroid involving   isthmus  and right inferior lobe. Stage I disease T2 N0 M0 tumor  On clinical ground there is no evidence of recurrent disease Papillary thyroid carcinoma Status post radioactive I-131 ablative therapy  PLAN  T4 is slightly higher so dose of Synthroid can be reduced to 125 g.    thyroglobulin is within normal limit  Patient expressed understanding and was in agreement with this plan. She also understands that She can call clinic at any time with any questions, concerns, or complaints.    Cancer of thyroid   Staging form: Thyroid - Papillary or Follicular (Under 45 years), AJCC 7th Edition     Clinical: Stage I (T2, N0, M0) - Signed by Forest Gleason, MD on 08/22/2014   Forest Gleason, MD   02/18/2015 8:40 AM

## 2015-02-19 ENCOUNTER — Telehealth: Payer: Self-pay | Admitting: *Deleted

## 2015-02-19 DIAGNOSIS — C73 Malignant neoplasm of thyroid gland: Secondary | ICD-10-CM

## 2015-02-19 MED ORDER — LEVOTHYROXINE SODIUM 125 MCG PO TABS
125.0000 ug | ORAL_TABLET | Freq: Every day | ORAL | Status: DC
Start: 2015-02-19 — End: 2015-07-16

## 2015-02-19 NOTE — Telephone Encounter (Signed)
-----   Message from Forest Gleason, MD sent at 02/18/2015  8:44 AM EST ----- Regarding: Levothyroxine t4 is high so dose of Synthroid needs to be decreased to 125 g

## 2015-02-19 NOTE — Telephone Encounter (Signed)
Left message with patient the synthroid dose needs to be adjusted to 167mcg. New prescription has been escribed to pharmacy. Pt instructed to call back if has any further questions.

## 2015-04-01 ENCOUNTER — Other Ambulatory Visit: Payer: Self-pay | Admitting: Orthopedic Surgery

## 2015-04-01 DIAGNOSIS — M25462 Effusion, left knee: Secondary | ICD-10-CM

## 2015-04-01 DIAGNOSIS — M25562 Pain in left knee: Secondary | ICD-10-CM

## 2015-04-03 ENCOUNTER — Other Ambulatory Visit: Payer: Self-pay | Admitting: *Deleted

## 2015-04-03 ENCOUNTER — Telehealth: Payer: Self-pay | Admitting: Obstetrics and Gynecology

## 2015-04-03 MED ORDER — NITROFURANTOIN MONOHYD MACRO 100 MG PO CAPS: 100.0000 mg | ORAL_CAPSULE | Freq: Two times a day (BID) | ORAL | Status: DC

## 2015-04-03 NOTE — Telephone Encounter (Signed)
Done-ac 

## 2015-04-03 NOTE — Telephone Encounter (Signed)
Patient thinks she may have a uti and didn't know if something can be called in for her. She is complaining of burning,frequency and painful urination. She uses the [facility] in [place]. Thanks

## 2015-04-11 ENCOUNTER — Encounter: Payer: Self-pay | Admitting: *Deleted

## 2015-04-12 ENCOUNTER — Ambulatory Visit: Payer: BLUE CROSS/BLUE SHIELD | Admitting: Family Medicine

## 2015-04-16 ENCOUNTER — Other Ambulatory Visit: Payer: Self-pay | Admitting: Obstetrics and Gynecology

## 2015-04-16 ENCOUNTER — Ambulatory Visit (INDEPENDENT_AMBULATORY_CARE_PROVIDER_SITE_OTHER): Payer: BLUE CROSS/BLUE SHIELD | Admitting: Obstetrics and Gynecology

## 2015-04-16 ENCOUNTER — Encounter: Payer: Self-pay | Admitting: Obstetrics and Gynecology

## 2015-04-16 ENCOUNTER — Ambulatory Visit (HOSPITAL_COMMUNITY): Payer: BLUE CROSS/BLUE SHIELD

## 2015-04-16 VITALS — BP 124/82 | HR 69 | Ht 66.0 in | Wt 190.0 lb

## 2015-04-16 DIAGNOSIS — E669 Obesity, unspecified: Secondary | ICD-10-CM | POA: Insufficient documentation

## 2015-04-16 DIAGNOSIS — Z01419 Encounter for gynecological examination (general) (routine) without abnormal findings: Secondary | ICD-10-CM

## 2015-04-16 MED ORDER — NORETHIN-ETH ESTRAD-FE BIPHAS 1 MG-10 MCG / 10 MCG PO TABS: 1.0000 | ORAL_TABLET | Freq: Every day | ORAL | Status: DC

## 2015-04-16 NOTE — Progress Notes (Signed)
  Subjective:     Kristen Powers is a 25 y.o. female and is here for a comprehensive physical exam. The patient reports weight gain and frustrated over thyroid med adjustments..  Social History   Social History  . Marital Status: Married    Spouse Name: N/A  . Number of Children: N/A  . Years of Education: N/A   Occupational History  . Not on file.   Social History Main Topics  . Smoking status: Never Smoker   . Smokeless tobacco: Not on file  . Alcohol Use: No  . Drug Use: No  . Sexual Activity: Yes    Birth Control/ Protection: Pill   Other Topics Concern  . Not on file   Social History Narrative   Health Maintenance  Topic Date Due  . HIV Screening  08/19/2003  . TETANUS/TDAP  08/19/2007  . INFLUENZA VACCINE  09/24/2014  . PAP SMEAR  03/21/2016    The following portions of the patient's history were reviewed and updated as appropriate: allergies, current medications, past family history, past medical history, past social history, past surgical history and problem list.  Review of Systems Pertinent items noted in HPI and remainder of comprehensive ROS otherwise negative.   Objective:    General appearance: alert, cooperative, appears stated age and mildly obese Neck: no adenopathy, no carotid bruit, no JVD, supple, symmetrical, trachea midline and thyroid not enlarged, symmetric, no tenderness/mass/nodules Lungs: clear to auscultation bilaterally Breasts: normal appearance, no masses or tenderness Heart: regular rate and rhythm, S1, S2 normal, no murmur, click, rub or gallop Abdomen: soft, non-tender; bowel sounds normal; no masses,  no organomegaly Pelvic: cervix normal in appearance, external genitalia normal, no adnexal masses or tenderness, no cervical motion tenderness, rectovaginal septum normal, uterus normal size, shape, and consistency and vagina normal without discharge    Assessment:    Healthy female exam. S/p thyroidectomy, Obesity, OCP user       Plan:    pap and routine labs obtained. Pills refilled See After Visit Summary for Counseling Recommendations

## 2015-04-16 NOTE — Patient Instructions (Signed)
Place annual gynecologic exam patient instructions here.

## 2015-04-17 LAB — COMPREHENSIVE METABOLIC PANEL
A/G RATIO: 1.8 (ref 1.1–2.5)
ALT: 11 IU/L (ref 0–32)
AST: 16 IU/L (ref 0–40)
Albumin: 4.5 g/dL (ref 3.5–5.5)
Alkaline Phosphatase: 89 IU/L (ref 39–117)
BILIRUBIN TOTAL: 0.3 mg/dL (ref 0.0–1.2)
BUN/Creatinine Ratio: 15 (ref 8–20)
BUN: 10 mg/dL (ref 6–20)
CALCIUM: 8.8 mg/dL (ref 8.7–10.2)
CO2: 21 mmol/L (ref 18–29)
Chloride: 99 mmol/L (ref 96–106)
Creatinine, Ser: 0.67 mg/dL (ref 0.57–1.00)
GFR calc Af Amer: 140 mL/min/{1.73_m2} (ref >59)
GFR, EST NON AFRICAN AMERICAN: 122 mL/min/{1.73_m2} (ref >59)
GLUCOSE: 71 mg/dL (ref 65–99)
Globulin, Total: 2.5 g/dL (ref 1.5–4.5)
Potassium: 4.6 mmol/L (ref 3.5–5.2)
Sodium: 138 mmol/L (ref 134–144)
TOTAL PROTEIN: 7 g/dL (ref 6.0–8.5)

## 2015-04-17 LAB — LIPID PANEL
CHOL/HDL RATIO: 3.8 ratio (ref 0.0–4.4)
Cholesterol, Total: 172 mg/dL (ref 100–199)
HDL: 45 mg/dL (ref >39)
LDL Calculated: 113 mg/dL — ABNORMAL HIGH (ref 0–99)
Triglycerides: 72 mg/dL (ref 0–149)
VLDL CHOLESTEROL CAL: 14 mg/dL (ref 5–40)

## 2015-04-17 LAB — THYROID PANEL WITH TSH
FREE THYROXINE INDEX: 4.4 (ref 1.2–4.9)
T3 UPTAKE RATIO: 29 % (ref 24–39)
T4, Total: 15.2 ug/dL — ABNORMAL HIGH (ref 4.5–12.0)
TSH: 0.039 u[IU]/mL — ABNORMAL LOW (ref 0.450–4.500)

## 2015-04-17 LAB — CYTOLOGY - PAP

## 2015-04-17 LAB — HEMOGLOBIN A1C
Est. average glucose Bld gHb Est-mCnc: 108 mg/dL
HEMOGLOBIN A1C: 5.4 % (ref 4.8–5.6)

## 2015-04-17 LAB — VITAMIN B12: Vitamin B-12: 351 pg/mL (ref 211–946)

## 2015-04-17 LAB — VITAMIN D 25 HYDROXY (VIT D DEFICIENCY, FRACTURES): Vit D, 25-Hydroxy: 34.7 ng/mL (ref 30.0–100.0)

## 2015-05-07 ENCOUNTER — Other Ambulatory Visit: Payer: BLUE CROSS/BLUE SHIELD

## 2015-06-18 ENCOUNTER — Encounter: Payer: Self-pay | Admitting: Obstetrics and Gynecology

## 2015-06-18 ENCOUNTER — Ambulatory Visit (INDEPENDENT_AMBULATORY_CARE_PROVIDER_SITE_OTHER): Payer: BLUE CROSS/BLUE SHIELD | Admitting: Obstetrics and Gynecology

## 2015-06-18 VITALS — BP 124/83 | HR 77 | Ht 66.0 in | Wt 186.0 lb

## 2015-06-18 DIAGNOSIS — E669 Obesity, unspecified: Secondary | ICD-10-CM | POA: Diagnosis not present

## 2015-06-18 MED ORDER — PHENTERMINE HCL 37.5 MG PO TABS: 37.5000 mg | ORAL_TABLET | Freq: Every day | ORAL | Status: DC

## 2015-06-18 MED ORDER — CYANOCOBALAMIN 1000 MCG/ML IJ SOLN
1000.0000 ug | INTRAMUSCULAR | Status: DC
Start: 2015-06-18 — End: 2015-09-17

## 2015-06-18 NOTE — Progress Notes (Signed)
Subjective:  Kristen Powers is a 25 y.o. G1P0 at Unknown being seen today for weight loss management- initial visit.  Patient reports General ROS: fatigue, frustration over weight fluctuations after thyroidectomy and med adjustments and reports previous weight loss attempts:   The following portions of the patient's history were reviewed and updated as appropriate: allergies, current medications, past family history, past medical history, past social history, past surgical history and problem list.   Objective:   Filed Vitals:   06/18/15 0933  BP: 124/83  Pulse: 77  Height: 5\' 6"  (1.676 m)  Weight: 186 lb (84.369 kg)    General:  Alert, oriented and cooperative. Patient is in no acute distress.  :   :   :   :   :   :   PE: Well groomed female in no current distress,   Mental Status: Normal mood and affect. Normal behavior. Normal judgment and thought content.   Current BMI: Body mass index is 30.04 kg/(m^2).   Assessment and Plan:  Obesity  1. Obesity (BMI 30.0-34.9)   Plan: low carb, High protein diet RX for adipex 37.5 mg daily and B12 1035mcg.ml monthly, to start now with first injection given at today's visit. Reviewed side-effects common to both medications and expected outcomes. Increase daily water intake to at least 8 bottle a day, every day.  Goal is to reduse weight by 10% by end of three months, and will re-evaluate then.  RTC in 4 weeks for Nurse visit to check weight & BP, and get next B12 injections.    Please refer to After Visit Summary for other counseling recommendations.    Melody N [place], CNM   Melody [employer], CNM      Consider the Low Glycemic Index Diet and 6 smaller meals daily .  This boosts your metabolism and regulates your sugars:   Use the protein bar by [employer]kins because they have lots of fiber in them  Find the low carb flatbreads, tortillas and pita breads for sandwiches:  Joseph's makes a pita bread and a flat  bread , available at [facility] and [employer]; Marissa makes a low carb flatbread available at [employer] and HT that is 9 net carbs and 100 cal [place] makes a low carb whole wheat tortilla available at [employer] most grocery stores with 6 net carbs and 210 cal  [place] yogurt can still have a lot of carbs .  Dannon Light N fit has 80 cal and 8 carbs

## 2015-07-16 ENCOUNTER — Ambulatory Visit (INDEPENDENT_AMBULATORY_CARE_PROVIDER_SITE_OTHER): Payer: BLUE CROSS/BLUE SHIELD | Admitting: Obstetrics and Gynecology

## 2015-07-16 VITALS — BP 125/86 | HR 104 | Ht 66.0 in | Wt 178.0 lb

## 2015-07-16 DIAGNOSIS — R5383 Other fatigue: Secondary | ICD-10-CM | POA: Diagnosis not present

## 2015-07-16 DIAGNOSIS — E890 Postprocedural hypothyroidism: Secondary | ICD-10-CM

## 2015-07-16 DIAGNOSIS — Z9889 Other specified postprocedural states: Secondary | ICD-10-CM

## 2015-07-16 DIAGNOSIS — E669 Obesity, unspecified: Secondary | ICD-10-CM

## 2015-07-16 MED ORDER — CYANOCOBALAMIN 1000 MCG/ML IJ SOLN
1000.0000 ug | Freq: Once | INTRAMUSCULAR | Status: AC
  Administered 2015-07-16: 1000 ug via INTRAMUSCULAR

## 2015-07-16 NOTE — Progress Notes (Signed)
Filed Weights   07/16/15 1029  Weight: 178 lb (80.74 kg)   Pt presents for WT, BP, and B12 injection for management of obesity. Pt today with weight loss of 8lbs. Gave pt verbal encouragement and praise for weight loss so far. Pt states that initially she was dizzy with Phentermine, so she began taking a half tablet which helped with symptoms. Pt states she has now increased to 1 whole tablet. Pt given 26mL injection of B12, pt tolerated well.  Pt to f/u in 1 month for Next injection of B12.

## 2015-07-16 NOTE — Patient Instructions (Signed)
FU in 1 month

## 2015-08-13 ENCOUNTER — Ambulatory Visit: Payer: BLUE CROSS/BLUE SHIELD | Admitting: Family Medicine

## 2015-08-13 ENCOUNTER — Other Ambulatory Visit: Payer: BLUE CROSS/BLUE SHIELD

## 2015-08-16 ENCOUNTER — Ambulatory Visit (INDEPENDENT_AMBULATORY_CARE_PROVIDER_SITE_OTHER): Payer: BLUE CROSS/BLUE SHIELD | Admitting: Obstetrics and Gynecology

## 2015-08-16 VITALS — BP 113/79 | HR 91 | Ht 66.0 in | Wt 173.8 lb

## 2015-08-16 DIAGNOSIS — E669 Obesity, unspecified: Secondary | ICD-10-CM | POA: Diagnosis not present

## 2015-08-16 MED ORDER — CYANOCOBALAMIN 1000 MCG/ML IJ SOLN
1000.0000 ug | Freq: Once | INTRAMUSCULAR | Status: AC
  Administered 2015-08-16: 1000 ug via INTRAMUSCULAR

## 2015-08-16 NOTE — Progress Notes (Signed)
Patient ID: Kristen Powers, female   DOB: 09/07/1990, 25 y.o.   MRN: [id number] Pt presents for wt,bp, and b12. Weight is down 5#. NO s/e from medication. Pt's b12 is kept in house.

## 2015-09-10 DIAGNOSIS — C73 Malignant neoplasm of thyroid gland: Secondary | ICD-10-CM | POA: Diagnosis not present

## 2015-09-17 ENCOUNTER — Ambulatory Visit (INDEPENDENT_AMBULATORY_CARE_PROVIDER_SITE_OTHER): Payer: BLUE CROSS/BLUE SHIELD | Admitting: Obstetrics and Gynecology

## 2015-09-17 VITALS — BP 125/94 | HR 97 | Ht 66.0 in | Wt 169.4 lb

## 2015-09-17 DIAGNOSIS — E669 Obesity, unspecified: Secondary | ICD-10-CM

## 2015-09-17 MED ORDER — PHENTERMINE HCL 37.5 MG PO TABS: 37.5000 mg | ORAL_TABLET | Freq: Every day | ORAL | 0 refills | Status: DC

## 2015-09-17 MED ORDER — CYANOCOBALAMIN 1000 MCG/ML IJ SOLN
1000.0000 ug | Freq: Once | INTRAMUSCULAR | Status: AC
  Administered 2015-09-17: 1000 ug via INTRAMUSCULAR

## 2015-09-17 MED ORDER — CYANOCOBALAMIN 1000 MCG/ML IJ SOLN: 1000.0000 ug | INTRAMUSCULAR | 4 refills | Status: DC

## 2015-09-17 NOTE — Progress Notes (Signed)
Patient ID: Kristen Powers, female   DOB: 1990-12-02, 25 y.o.   MRN: [id number] Pt presents for weight, B/P, B-12 injection. No side effects of medication-Phentermine, or B-12.  Weight loss of  4 lbs. Encouraged eating healthy and exercise. Rx for phentermine x1 month, and B-12 ordered. Pt to see MNS next visit. Thyroid labs to be checked in August by endocrinologist.

## 2015-10-29 ENCOUNTER — Other Ambulatory Visit: Payer: Self-pay | Admitting: *Deleted

## 2015-10-29 DIAGNOSIS — E890 Postprocedural hypothyroidism: Secondary | ICD-10-CM | POA: Diagnosis not present

## 2015-10-29 MED ORDER — PHENTERMINE HCL 37.5 MG PO TABS
37.5000 mg | ORAL_TABLET | Freq: Every day | ORAL | 2 refills | Status: DC
Start: 2015-10-29 — End: 2016-07-24

## 2015-10-30 ENCOUNTER — Ambulatory Visit: Payer: BLUE CROSS/BLUE SHIELD | Admitting: Obstetrics and Gynecology

## 2015-11-26 DIAGNOSIS — Z85850 Personal history of malignant neoplasm of thyroid: Secondary | ICD-10-CM | POA: Diagnosis not present

## 2015-11-26 DIAGNOSIS — J028 Acute pharyngitis due to other specified organisms: Secondary | ICD-10-CM | POA: Diagnosis not present

## 2015-12-31 IMAGING — US US THYROID BIOPSY
1 series · 13 of 14 positions shown · non-contrast
Comparison: 06/19/2014

CLINICAL DATA: Nodule in the isthmus. The patient returns for
Fazal analysis.

EXAM:
ULTRASOUND GUIDED NEEDLE ASPIRATE BIOPSY OF THE THYROID GLAND

[Series 1: us thyroid biopsy · 0.06mm/px · 13 of 14 slices shown]
[im 1/14]
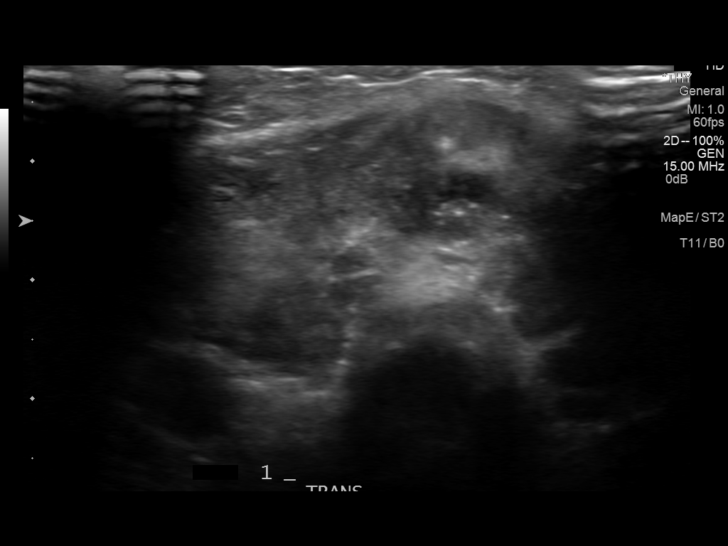
[im 2/14]
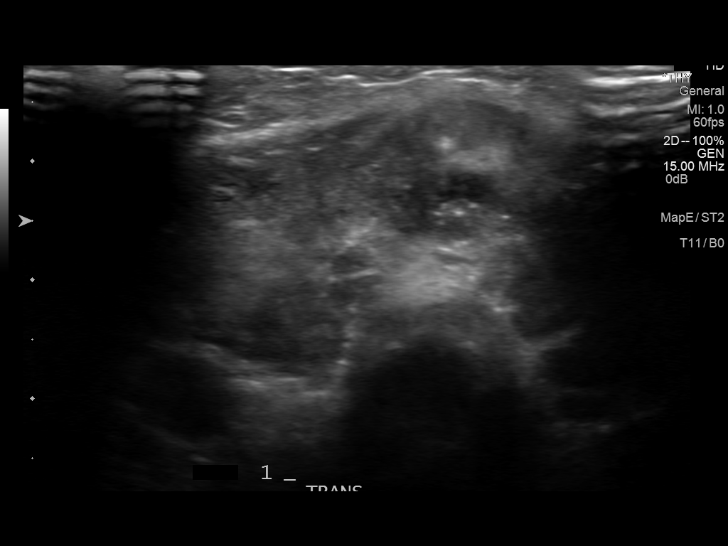
[im 3/14]
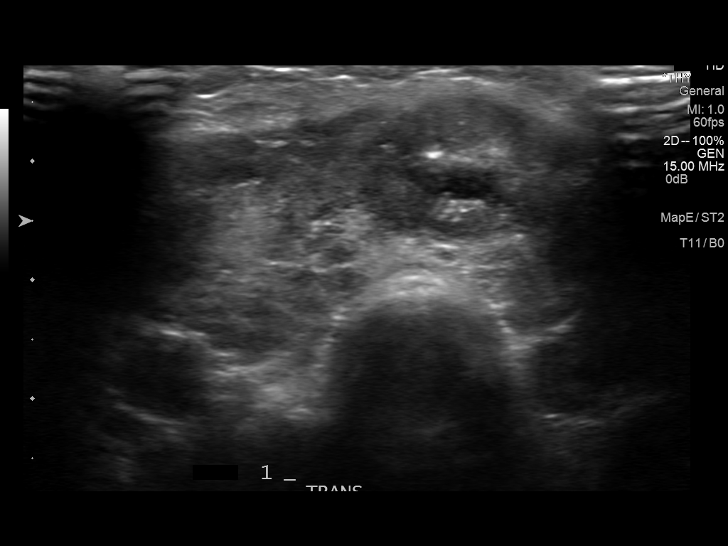
[im 4/14]
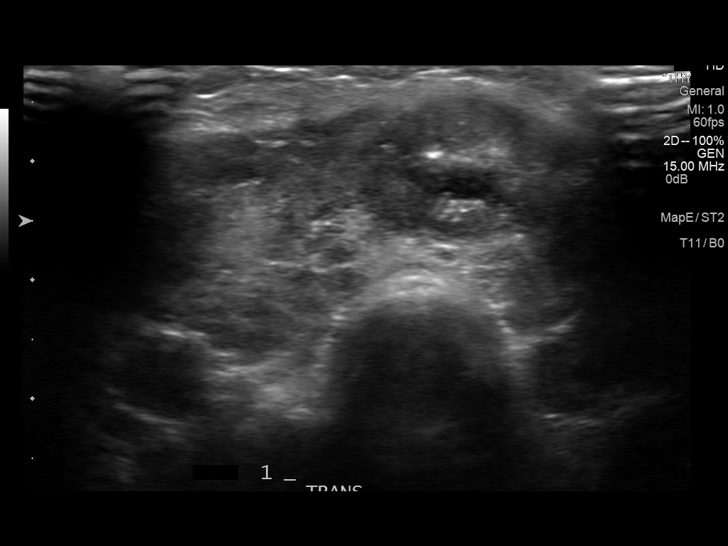
[im 5/14]
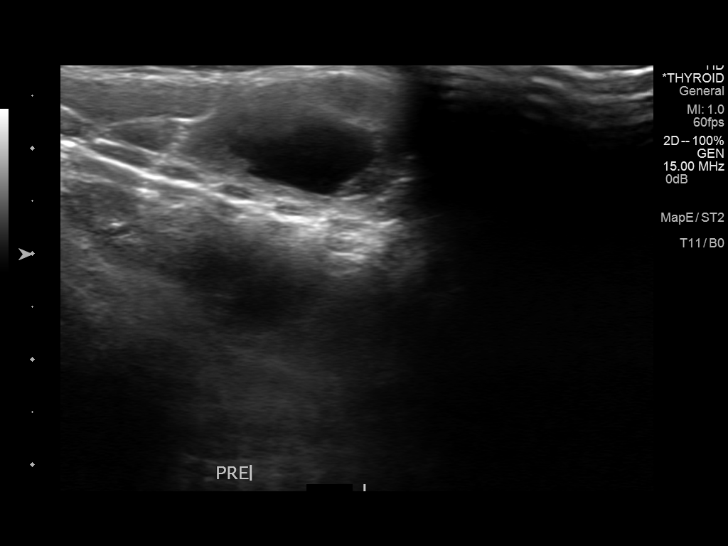
[im 6/14]
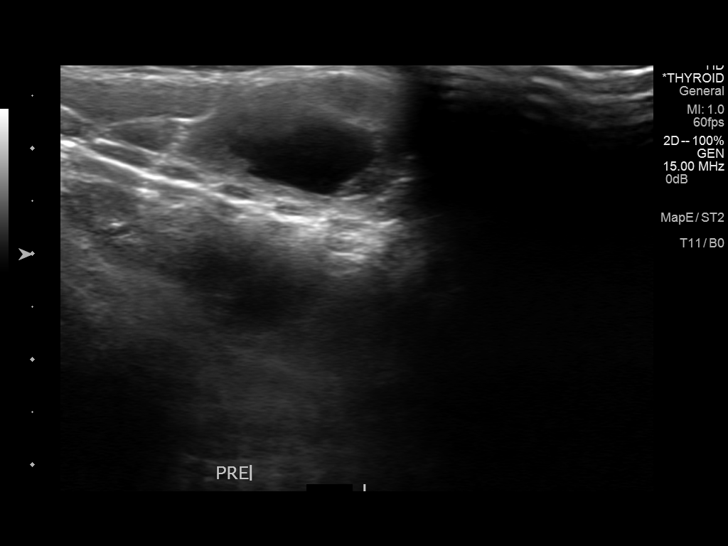
[im 8/14]
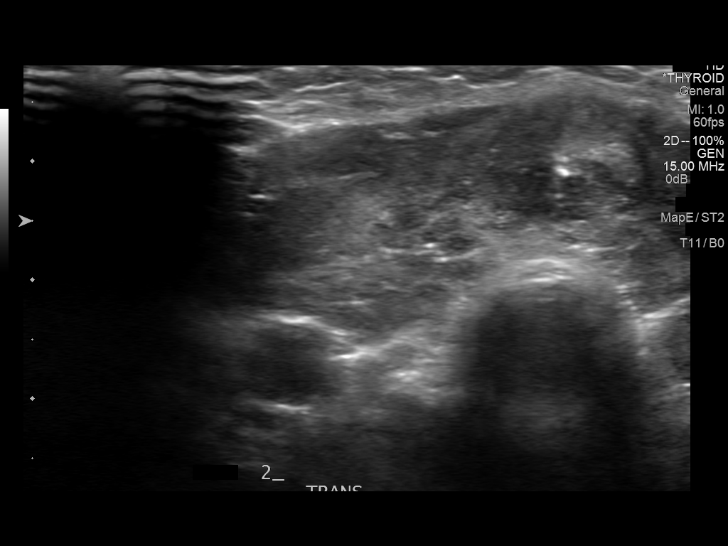
[im 9/14]
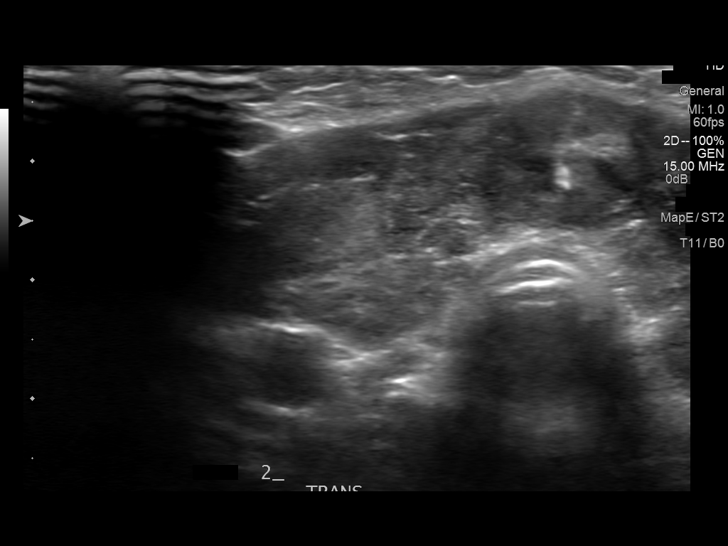
[im 10/14]
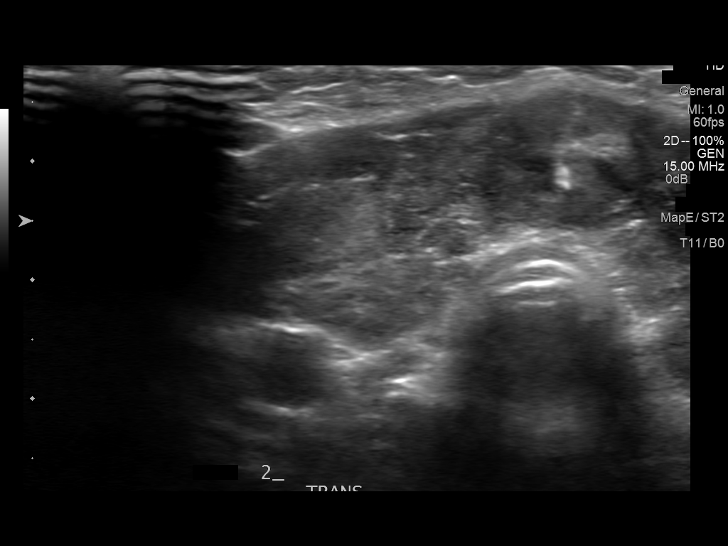
[im 11/14]
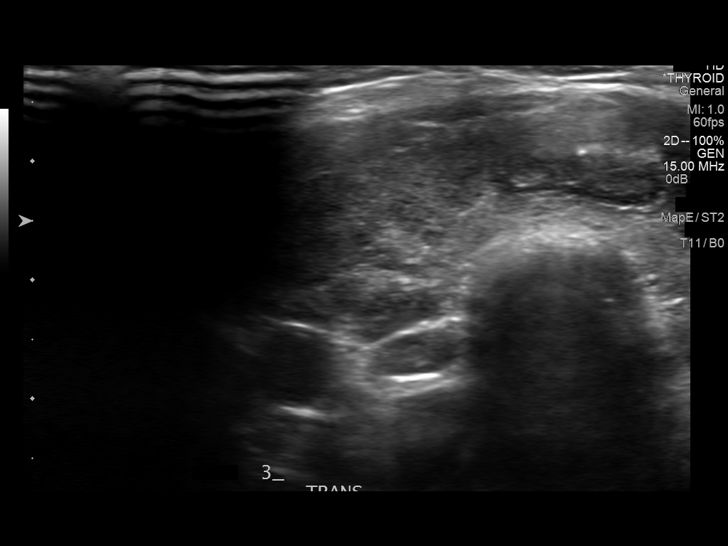
[im 12/14]
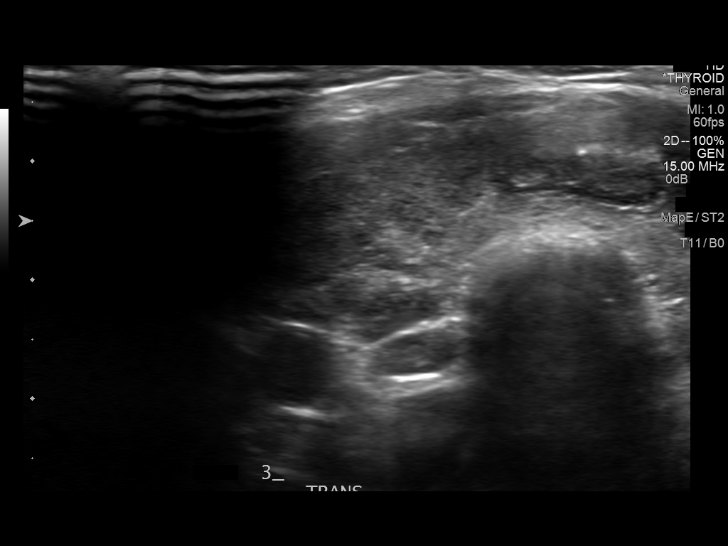
[im 13/14]
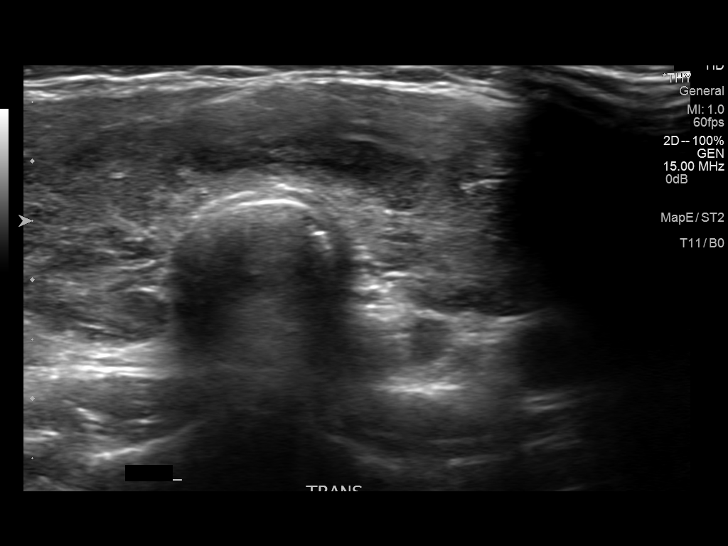
[im 14/14]
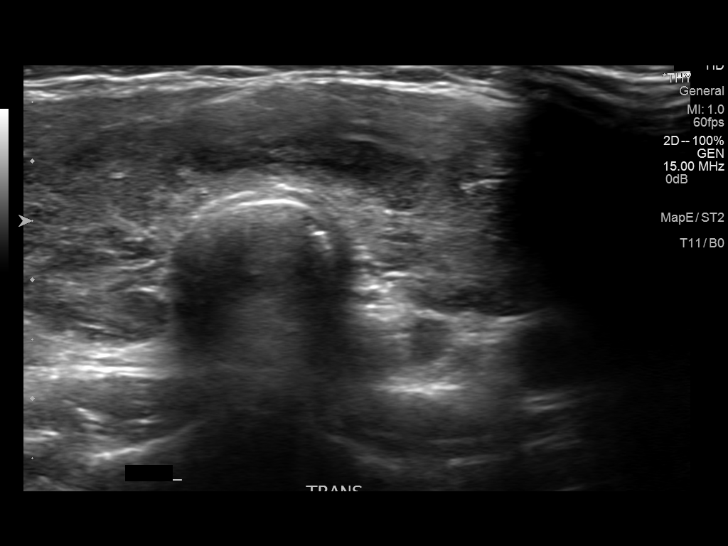

[13 of 14 positions shown; findings below may reference images not displayed]

PROCEDURE:
Thyroid biopsy was thoroughly discussed with the patient and
questions were answered. The benefits, risks, alternatives, and
complications were also discussed. The patient understands and
wishes to proceed with the procedure. Written consent was obtained.

Ultrasound was performed to localize and mark an adequate site for
the biopsy. The patient was then prepped and draped in a normal
sterile fashion. Local anesthesia was provided with 1% lidocaine.
Using direct ultrasound guidance, 2 passes were made using needles
into the nodule within the isthmus lobe of the thyroid. An 18 gauge
needle was then utilized to aspirate 2 cc dark fluid from the cystic
portion of the nodule. Ultrasound was used to confirm needle
placements on all occasions. Specimens were sent to Pathology for
analysis.

COMPLICATIONS:
None
FINDINGS: Images document needle placement in the described thyroid nodule.
Post or aspiration imaging demonstrates resolution of the cystic
component.
IMPRESSION: Ultrasound guided needle aspirate biopsy performed of the isthmic
thyroid nodule.

## 2016-02-06 DIAGNOSIS — C73 Malignant neoplasm of thyroid gland: Secondary | ICD-10-CM | POA: Diagnosis not present

## 2016-03-16 ENCOUNTER — Other Ambulatory Visit: Payer: Self-pay

## 2016-03-16 DIAGNOSIS — N390 Urinary tract infection, site not specified: Secondary | ICD-10-CM

## 2016-03-16 MED ORDER — NITROFURANTOIN MONOHYD MACRO 100 MG PO CAPS: 100.0000 mg | ORAL_CAPSULE | Freq: Two times a day (BID) | ORAL | 0 refills | Status: DC

## 2016-04-21 ENCOUNTER — Encounter: Payer: Self-pay | Admitting: Obstetrics and Gynecology

## 2016-04-21 ENCOUNTER — Ambulatory Visit (INDEPENDENT_AMBULATORY_CARE_PROVIDER_SITE_OTHER): Payer: BLUE CROSS/BLUE SHIELD | Admitting: Obstetrics and Gynecology

## 2016-04-21 VITALS — BP 128/78 | HR 98 | Ht 66.0 in | Wt 168.4 lb

## 2016-04-21 DIAGNOSIS — Z01419 Encounter for gynecological examination (general) (routine) without abnormal findings: Secondary | ICD-10-CM

## 2016-04-21 NOTE — Patient Instructions (Signed)
 Preventive Care 18-39 Years, Female Preventive care refers to lifestyle choices and visits with your health care provider that can promote health and wellness. What does preventive care include?  A yearly physical exam. This is also called an annual well check.  Dental exams once or twice a year.  Routine eye exams. Ask your health care provider how often you should have your eyes checked.  Personal lifestyle choices, including:  Daily care of your teeth and gums.  Regular physical activity.  Eating a healthy diet.  Avoiding tobacco and drug use.  Limiting alcohol use.  Practicing safe sex.  Taking vitamin and mineral supplements as recommended by your health care provider. What happens during an annual well check? The services and screenings done by your health care provider during your annual well check will depend on your age, overall health, lifestyle risk factors, and family history of disease. Counseling  Your health care provider may ask you questions about your:  Alcohol use.  Tobacco use.  Drug use.  Emotional well-being.  Home and relationship well-being.  Sexual activity.  Eating habits.  Work and work environment.  Method of birth control.  Menstrual cycle.  Pregnancy history. Screening  You may have the following tests or measurements:  Height, weight, and BMI.  Diabetes screening. This is done by checking your blood sugar (glucose) after you have not eaten for a while (fasting).  Blood pressure.  Lipid and cholesterol levels. These may be checked every 5 years starting at age 20.  Skin check.  Hepatitis C blood test.  Hepatitis B blood test.  Sexually transmitted disease (STD) testing.  BRCA-related cancer screening. This may be done if you have a family history of breast, ovarian, tubal, or peritoneal cancers.  Pelvic exam and Pap test. This may be done every 3 years starting at age 21. Starting at age 30, this may be done  every 5 years if you have a Pap test in combination with an HPV test. Discuss your test results, treatment options, and if necessary, the need for more tests with your health care provider. Vaccines  Your health care provider may recommend certain vaccines, such as:  Influenza vaccine. This is recommended every year.  Tetanus, diphtheria, and acellular pertussis (Tdap, Td) vaccine. You may need a Td booster every 10 years.  Varicella vaccine. You may need this if you have not been vaccinated.  HPV vaccine. If you are 26 or younger, you may need three doses over 6 months.  Measles, mumps, and rubella (MMR) vaccine. You may need at least one dose of MMR. You may also need a second dose.  Pneumococcal 13-valent conjugate (PCV13) vaccine. You may need this if you have certain conditions and were not previously vaccinated.  Pneumococcal polysaccharide (PPSV23) vaccine. You may need one or two doses if you smoke cigarettes or if you have certain conditions.  Meningococcal vaccine. One dose is recommended if you are age 19-21 years and a first-year college student living in a residence hall, or if you have one of several medical conditions. You may also need additional booster doses.  Hepatitis A vaccine. You may need this if you have certain conditions or if you travel or work in places where you may be exposed to hepatitis A.  Hepatitis B vaccine. You may need this if you have certain conditions or if you travel or work in places where you may be exposed to hepatitis B.  Haemophilus influenzae type b (Hib) vaccine. You may need   this if you have certain risk factors. Talk to your health care provider about which screenings and vaccines you need and how often you need them. This information is not intended to replace advice given to you by your health care provider. Make sure you discuss any questions you have with your health care provider. Document Released: 04/07/2001 Document Revised:  10/30/2015 Document Reviewed: 12/11/2014 Elsevier Interactive Patient Education  2017 Elsevier Inc.  

## 2016-04-21 NOTE — Progress Notes (Signed)
  Subjective:     Kristen Powers is a 26 y.o. female married, works FT as Emergency planning/management officer. 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 The patient reports no problems.   Social History   Social History  . Marital status: Married    Spouse name: N/A  . Number of children: N/A  . Years of education: N/A   Occupational History  . Not on file.   Social History Main Topics  . Smoking status: Never Smoker  . Smokeless tobacco: Never Used  . Alcohol use No  . Drug use: No  . Sexual activity: Yes    Birth control/ protection: Pill   Other Topics Concern  . Not on file   Social History Narrative  . No narrative on file   Health Maintenance  Topic Date Due  . HIV Screening  08/19/2003  . TETANUS/TDAP  08/19/2007  . INFLUENZA VACCINE  09/24/2015  . PAP SMEAR  04/15/2018    The following portions of the patient's history were reviewed and updated as appropriate: allergies, current medications, past family history, past medical history, past social history, past surgical history and problem list.  Review of Systems A comprehensive review of systems was negative.   Objective:    General appearance: alert, cooperative and appears stated age Neck: no adenopathy, no carotid bruit, no JVD, supple, symmetrical, trachea midline and thyroid not enlarged, symmetric, no tenderness/mass/nodules Lungs: clear to auscultation bilaterally Breasts: normal appearance, no masses or tenderness Heart: regular rate and rhythm, S1, S2 normal, no murmur, click, rub or gallop Abdomen: soft, non-tender; bowel sounds normal; no masses,  no organomegaly Pelvic: cervix normal in appearance, external genitalia normal, no adnexal masses or tenderness, no cervical motion tenderness, rectovaginal septum normal, uterus normal size, shape, and consistency and vagina normal without discharge    Assessment:    Healthy female exam. Hypothyroidism, overweight, OCP user     Plan:  Will restart weight loss  medications, no need for labs at today's visit.  RTC 1 year or as needed.  Melody Gayla Medicus, CNM   See After Visit Summary for Counseling Recommendations

## 2016-05-19 ENCOUNTER — Ambulatory Visit (INDEPENDENT_AMBULATORY_CARE_PROVIDER_SITE_OTHER): Payer: BLUE CROSS/BLUE SHIELD | Admitting: Obstetrics and Gynecology

## 2016-05-19 VITALS — BP 125/74 | HR 79 | Wt 164.2 lb

## 2016-05-19 DIAGNOSIS — E663 Overweight: Secondary | ICD-10-CM

## 2016-05-19 MED ORDER — CYANOCOBALAMIN 1000 MCG/ML IJ SOLN
1000.0000 ug | Freq: Once | INTRAMUSCULAR | Status: AC
  Administered 2016-05-19: 1000 ug via INTRAMUSCULAR

## 2016-05-19 NOTE — Progress Notes (Signed)
Pt is here for wt, bp check, b-12 inj She denies any s/e, is exercising and happy with her weight loss   04/21/16 wt- 168lb 05/19/16 wt- 164

## 2016-06-02 DIAGNOSIS — Z85850 Personal history of malignant neoplasm of thyroid: Secondary | ICD-10-CM | POA: Diagnosis not present

## 2016-06-19 ENCOUNTER — Ambulatory Visit (INDEPENDENT_AMBULATORY_CARE_PROVIDER_SITE_OTHER): Payer: BLUE CROSS/BLUE SHIELD | Admitting: Obstetrics and Gynecology

## 2016-06-19 VITALS — BP 130/76 | HR 87 | Ht 66.0 in | Wt 163.0 lb

## 2016-06-19 DIAGNOSIS — E663 Overweight: Secondary | ICD-10-CM

## 2016-06-19 MED ORDER — CYANOCOBALAMIN 1000 MCG/ML IJ SOLN
1000.0000 ug | Freq: Once | INTRAMUSCULAR | Status: AC
  Administered 2016-06-19: 1000 ug via INTRAMUSCULAR

## 2016-06-19 NOTE — Progress Notes (Signed)
Patient ID: Kristen Powers, female   DOB: [birth year], 26 y.o.   MRN: [id number] Pt presents for weight, B/P, B-12 injection. No side effects of medication-Phentermine, or B-12.  Weight loss of 1 lbs. Encouraged eating healthy and exercise.

## 2016-07-14 IMAGING — NM NM [ID] THYROID CANCER METS SP CA TX
1 series · 2 of 2 positions shown · non-contrast
Comparison: None.

CLINICAL DATA: Status post P-HQH remnant ablation for low risk
papillary thyroid cancer.

EXAM:
NUCLEAR MEDICINE P-HQH POST THERAPY WHOLE BODY SCAN
TECHNIQUE: The patient received 45.2 mCi P-HQH sodium iodide for the treatment
of thyroid cancer within the past 10 days. The patient returns
today, and whole body scanning was performed in the anterior and
posterior projections.

[Series 1000: (id) wb scan · 2.40mm/px · 2 of 2 frames shown]
[frame 1/2]
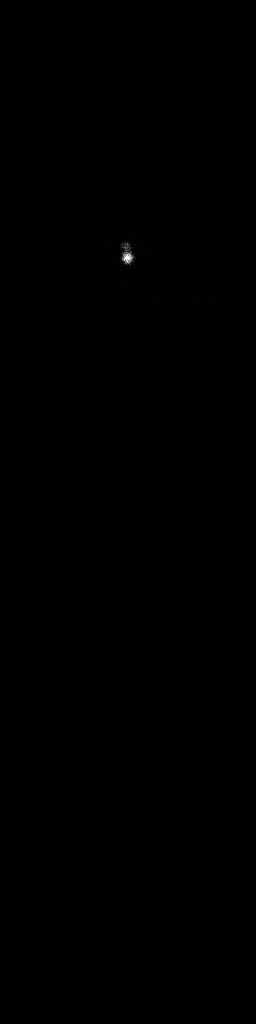
[frame 2/2]
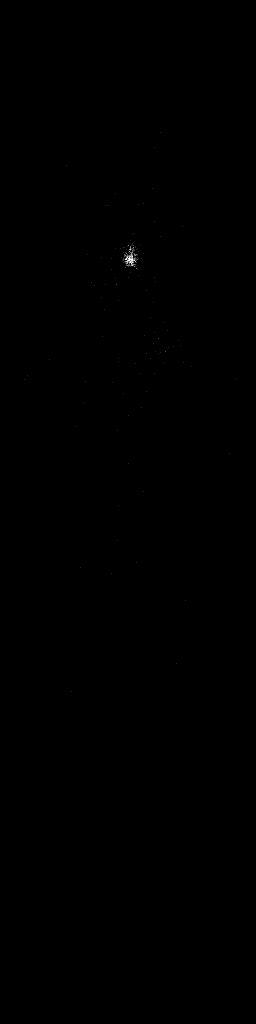

[2 of 2 positions shown; findings below may reference images not displayed]

FINDINGS: There is a large focus of intense uptake identified within the
thyroid bed compatible with residual functioning thyroid tissue. No
additional foci of increased uptake identified to suggest metastatic
disease.
IMPRESSION: 1. Expected functioning thyroid tissue identified within the thyroid
bed. No evidence for metastatic disease.

## 2016-07-24 ENCOUNTER — Ambulatory Visit (INDEPENDENT_AMBULATORY_CARE_PROVIDER_SITE_OTHER): Payer: BLUE CROSS/BLUE SHIELD | Admitting: Obstetrics and Gynecology

## 2016-07-24 VITALS — BP 120/74 | HR 81 | Ht 66.0 in | Wt 165.0 lb

## 2016-07-24 DIAGNOSIS — E663 Overweight: Secondary | ICD-10-CM

## 2016-07-24 MED ORDER — CYANOCOBALAMIN 1000 MCG/ML IJ SOLN
1000.0000 ug | Freq: Once | INTRAMUSCULAR | Status: AC
  Administered 2016-07-24: 1000 ug via INTRAMUSCULAR

## 2016-07-24 MED ORDER — PHENTERMINE HCL 37.5 MG PO CAPS: 37.5000 mg | ORAL_CAPSULE | ORAL | 0 refills | Status: DC

## 2016-07-24 NOTE — Progress Notes (Signed)
Patient ID: Kristen Powers, female   DOB: 08-Apr-1990, 26 y.o.   MRN: [id number] Pt presents for weight, B/P, B-12 injection. No side effects of medication-Phentermine, or B-12.  Weight gain of _2_ lbs. Encouraged eating healthy and exercise. Pt was to see [facility] for weight management but had a nurse visit instead. Was given Rx for phentermine with no refills per [facility]. Pt. Is to schedule appt with [facility] for next visit.

## 2016-08-21 ENCOUNTER — Encounter: Payer: Self-pay | Admitting: Obstetrics and Gynecology

## 2016-08-21 ENCOUNTER — Ambulatory Visit (INDEPENDENT_AMBULATORY_CARE_PROVIDER_SITE_OTHER): Payer: BLUE CROSS/BLUE SHIELD | Admitting: Obstetrics and Gynecology

## 2016-08-21 VITALS — BP 128/78 | HR 88 | Wt 163.0 lb

## 2016-08-21 DIAGNOSIS — E663 Overweight: Secondary | ICD-10-CM | POA: Diagnosis not present

## 2016-08-21 MED ORDER — CYANOCOBALAMIN 1000 MCG/ML IJ SOLN
1000.0000 ug | Freq: Once | INTRAMUSCULAR | Status: AC
  Administered 2016-08-21: 1000 ug via INTRAMUSCULAR

## 2016-08-21 NOTE — Progress Notes (Signed)
Pt is here for wt, bp check, b-12 inj She is doing well, doesn't feel like there phentermine is working, she is going to take her a 2 week break from the medication  08/21/16 wt- 163lb 07/24/16 wt- 165lb

## 2016-09-22 ENCOUNTER — Encounter: Payer: BLUE CROSS/BLUE SHIELD | Admitting: Obstetrics and Gynecology

## 2016-09-28 ENCOUNTER — Ambulatory Visit (INDEPENDENT_AMBULATORY_CARE_PROVIDER_SITE_OTHER): Payer: BLUE CROSS/BLUE SHIELD | Admitting: Obstetrics and Gynecology

## 2016-09-28 ENCOUNTER — Encounter: Payer: BLUE CROSS/BLUE SHIELD | Admitting: Obstetrics and Gynecology

## 2016-09-28 VITALS — BP 115/79 | HR 80 | Wt 166.0 lb

## 2016-09-28 DIAGNOSIS — E663 Overweight: Secondary | ICD-10-CM

## 2016-09-28 MED ORDER — CYANOCOBALAMIN 1000 MCG/ML IJ SOLN
1000.0000 ug | Freq: Once | INTRAMUSCULAR | Status: AC
  Administered 2016-09-28: 1000 ug via INTRAMUSCULAR

## 2016-09-28 NOTE — Progress Notes (Signed)
Pt is here for wt, bp check, b-12 inj, she hasnt been on the phentermine for 3 weeks, took a break from the medication, would like to re-start the medication  09/28/16 wt- 166lb 08/21/16 wt-163lb

## 2016-10-30 ENCOUNTER — Ambulatory Visit: Payer: BLUE CROSS/BLUE SHIELD | Admitting: Obstetrics and Gynecology

## 2016-11-30 DIAGNOSIS — Z85850 Personal history of malignant neoplasm of thyroid: Secondary | ICD-10-CM | POA: Diagnosis not present

## 2016-12-01 DIAGNOSIS — Z85850 Personal history of malignant neoplasm of thyroid: Secondary | ICD-10-CM | POA: Diagnosis not present

## 2016-12-01 DIAGNOSIS — H6982 Other specified disorders of Eustachian tube, left ear: Secondary | ICD-10-CM | POA: Diagnosis not present

## 2017-01-28 ENCOUNTER — Encounter: Payer: Self-pay | Admitting: Physician Assistant

## 2017-01-28 ENCOUNTER — Ambulatory Visit (INDEPENDENT_AMBULATORY_CARE_PROVIDER_SITE_OTHER): Payer: BLUE CROSS/BLUE SHIELD | Admitting: Physician Assistant

## 2017-01-28 VITALS — BP 118/72 | HR 64 | Temp 98.3°F | Resp 16 | Wt 178.0 lb

## 2017-01-28 DIAGNOSIS — H00014 Hordeolum externum left upper eyelid: Secondary | ICD-10-CM | POA: Diagnosis not present

## 2017-01-28 NOTE — Patient Instructions (Signed)

## 2017-01-28 NOTE — Progress Notes (Addendum)
Patient: Kristen Powers Female    DOB: 06-Mar-1990   26 y.o.   MRN: [id number] Visit Date: 01/28/2017  Today's Provider: Trinna Post, PA-C   Chief Complaint  Patient presents with  . Conjunctivitis    Started Sunday morning.  Pt reports sleeping with her eye makeup on Saturday night.    Subjective:    Kristen Powers is a 26 y/o woman presenting for left eye pain x 5 days. Reports sleeping in makeup Saturday and waking up with red eye, swelling, Denies eye pain, eye pain during movements, changes in vision, diplopia. Endorses some discharge, but no conjunctival redness. She denies any pain around here eyeball.   Conjunctivitis   The current episode started 5 to 7 days ago. The problem has been gradually worsening. Nothing relieves the symptoms. Associated symptoms include eye discharge. Pertinent negatives include no fever, no decreased vision, no double vision, no eye itching, no photophobia, no congestion, no ear discharge, no rhinorrhea, no sore throat, no eye pain and no eye redness. The left eye is affected.       Allergies  Allergen Reactions  . Latex Swelling and Rash  . Topamax [Topiramate]     Other reaction(s): Unknown     Current Outpatient Medications:  .  SYNTHROID 150 MCG tablet, , Disp: , Rfl: 5 .  cyanocobalamin (,VITAMIN B-12,) 1000 MCG/ML injection, Inject 1 mL (1,000 mcg total) into the muscle every 30 (thirty) days. (Patient not taking: Reported on 01/28/2017), Disp: 10 mL, Rfl: 4 .  Norethindrone-Ethinyl Estradiol-Fe Biphas (LO LOESTRIN FE) 1 MG-10 MCG / 10 MCG tablet, Take 1 tablet by mouth daily. (Patient not taking: Reported on 01/28/2017), Disp: 3 Package, Rfl: 4 .  phentermine 37.5 MG capsule, Take 1 capsule (37.5 mg total) by mouth every morning. (Patient not taking: Reported on 09/28/2016), Disp: 30 capsule, Rfl: 0  Review of Systems  Constitutional: Negative.  Negative for fever.  HENT: Negative for congestion, ear discharge,  rhinorrhea and sore throat.   Eyes: Positive for discharge. Negative for double vision, photophobia, pain, redness, itching and visual disturbance.    Social History   Tobacco Use  . Smoking status: Never Smoker  . Smokeless tobacco: Never Used  Substance Use Topics  . Alcohol use: No   Objective:   BP 118/72 (BP Location: Left Arm, Patient Position: Sitting, Cuff Size: Normal)   Pulse 64   Temp 98.3 F (36.8 C) (Oral)   Resp 16   Wt 178 lb (80.7 kg)   LMP 01/27/2017   BMI 28.73 kg/m  Vitals:   01/28/17 1204  BP: 118/72  Pulse: 64  Resp: 16  Temp: 98.3 F (36.8 C)  TempSrc: Oral  Weight: 178 lb (80.7 kg)     Physical Exam  Constitutional: She is oriented to person, place, and time. She appears well-developed and well-nourished.  Eyes: Conjunctivae and EOM are normal. Pupils are equal, round, and reactive to light. Right eye exhibits no chemosis, no discharge, no exudate and no hordeolum. No foreign body present in the right eye. Left eye exhibits hordeolum. Left eye exhibits no chemosis, no discharge and no exudate. No foreign body present in the left eye. Right conjunctiva is not injected. Left conjunctiva is not injected.  Swelling limited only to left upper lid with tenderness to palpation. No swelling, edema, redness of periorbital area.  Cardiovascular: Normal rate.  Pulmonary/Chest: Effort normal.  Neurological: She is alert and oriented to person, place, and  time.  Skin: Skin is warm and dry.  Psychiatric: She has a normal mood and affect. Her behavior is normal.        Assessment & Plan:     1. Hordeolum externum of left upper eyelid  Counseled on warm compresses four times daily. Please return if redness and swelling spreads to tissue surrounding the eye or forehead, will need to be re-evaluated for preseptal cellulitis.   Return if symptoms worsen or fail to improve.  The entirety of the information documented in the History of Present Illness, Review  of Systems and Physical Exam were personally obtained by me. Portions of this information were initially documented by Ashley Royalty, CMA and reviewed by me for thoroughness and accuracy.          Trinna Post, PA-C  [facility]

## 2017-04-15 ENCOUNTER — Telehealth: Payer: Self-pay | Admitting: Obstetrics and Gynecology

## 2017-04-15 NOTE — Telephone Encounter (Signed)
Patient called stating she is going on a missions trip on March 28th to [place] and needs to get some immunizations before she goes. She needs Dtap, Hep A&B, typhoid and cippro. She stated if we dont have them she can get them at [facility] with a script. Thanks.

## 2017-04-16 NOTE — Telephone Encounter (Signed)
Pt aware we only have tdap in house. Pt did received tdap at last pregnancy(2016). Advised to go to [facility]. She will contact them. Any other questions she will contact office.

## 2017-04-22 ENCOUNTER — Other Ambulatory Visit: Payer: Self-pay | Admitting: *Deleted

## 2017-04-22 MED ORDER — CIPROFLOXACIN HCL 500 MG PO TABS: 500.0000 mg | ORAL_TABLET | Freq: Two times a day (BID) | ORAL | 1 refills | Status: DC

## 2017-04-27 DIAGNOSIS — Z85850 Personal history of malignant neoplasm of thyroid: Secondary | ICD-10-CM | POA: Diagnosis not present

## 2017-04-27 DIAGNOSIS — Z23 Encounter for immunization: Secondary | ICD-10-CM | POA: Diagnosis not present

## 2017-04-28 ENCOUNTER — Encounter: Payer: BLUE CROSS/BLUE SHIELD | Admitting: Obstetrics and Gynecology

## 2017-06-01 ENCOUNTER — Other Ambulatory Visit: Payer: Self-pay | Admitting: Obstetrics and Gynecology

## 2017-06-01 ENCOUNTER — Encounter: Payer: Self-pay | Admitting: Obstetrics and Gynecology

## 2017-06-01 ENCOUNTER — Ambulatory Visit (INDEPENDENT_AMBULATORY_CARE_PROVIDER_SITE_OTHER): Payer: BLUE CROSS/BLUE SHIELD | Admitting: Obstetrics and Gynecology

## 2017-06-01 VITALS — BP 116/83 | HR 75 | Ht 66.0 in | Wt 172.7 lb

## 2017-06-01 DIAGNOSIS — Z01419 Encounter for gynecological examination (general) (routine) without abnormal findings: Secondary | ICD-10-CM | POA: Diagnosis not present

## 2017-06-01 NOTE — Patient Instructions (Signed)
Preventive Care 18-39 Years, Female Preventive care refers to lifestyle choices and visits with your health care provider that can promote health and wellness. What does preventive care include?  A yearly physical exam. This is also called an annual well check.  Dental exams once or twice a year.  Routine eye exams. Ask your health care provider how often you should have your eyes checked.  Personal lifestyle choices, including: ? Daily care of your teeth and gums. ? Regular physical activity. ? Eating a healthy diet. ? Avoiding tobacco and drug use. ? Limiting alcohol use. ? Practicing safe sex. ? Taking vitamin and mineral supplements as recommended by your health care provider. What happens during an annual well check? The services and screenings done by your health care provider during your annual well check will depend on your age, overall health, lifestyle risk factors, and family history of disease. Counseling Your health care provider may ask you questions about your:  Alcohol use.  Tobacco use.  Drug use.  Emotional well-being.  Home and relationship well-being.  Sexual activity.  Eating habits.  Work and work Statistician.  Method of birth control.  Menstrual cycle.  Pregnancy history.  Screening You may have the following tests or measurements:  Height, weight, and BMI.  Diabetes screening. This is done by checking your blood sugar (glucose) after you have not eaten for a while (fasting).  Blood pressure.  Lipid and cholesterol levels. These may be checked every 5 years starting at age 38.  Skin check.  Hepatitis C blood test.  Hepatitis B blood test.  Sexually transmitted disease (STD) testing.  BRCA-related cancer screening. This may be done if you have a family history of breast, ovarian, tubal, or peritoneal cancers.  Pelvic exam and Pap test. This may be done every 3 years starting at age 38. Starting at age 30, this may be done  every 5 years if you have a Pap test in combination with an HPV test.  Discuss your test results, treatment options, and if necessary, the need for more tests with your health care provider. Vaccines Your health care provider may recommend certain vaccines, such as:  Influenza vaccine. This is recommended every year.  Tetanus, diphtheria, and acellular pertussis (Tdap, Td) vaccine. You may need a Td booster every 10 years.  Varicella vaccine. You may need this if you have not been vaccinated.  HPV vaccine. If you are 39 or younger, you may need three doses over 6 months.  Measles, mumps, and rubella (MMR) vaccine. You may need at least one dose of MMR. You may also need a second dose.  Pneumococcal 13-valent conjugate (PCV13) vaccine. You may need this if you have certain conditions and were not previously vaccinated.  Pneumococcal polysaccharide (PPSV23) vaccine. You may need one or two doses if you smoke cigarettes or if you have certain conditions.  Meningococcal vaccine. One dose is recommended if you are age 68-21 years and a first-year college student living in a residence hall, or if you have one of several medical conditions. You may also need additional booster doses.  Hepatitis A vaccine. You may need this if you have certain conditions or if you travel or work in places where you may be exposed to hepatitis A.  Hepatitis B vaccine. You may need this if you have certain conditions or if you travel or work in places where you may be exposed to hepatitis B.  Haemophilus influenzae type b (Hib) vaccine. You may need this  if you have certain risk factors.  Talk to your health care provider about which screenings and vaccines you need and how often you need them. This information is not intended to replace advice given to you by your health care provider. Make sure you discuss any questions you have with your health care provider. Document Released: 04/07/2001 Document Revised:  10/30/2015 Document Reviewed: 12/11/2014 Elsevier Interactive Patient Education  2018 Elsevier Inc.  

## 2017-06-01 NOTE — Progress Notes (Signed)
  Subjective:     Kristen Powers is a married white 27 y.o. female and is here for a comprehensive physical exam. G1P1 with SVD x1. The patient reports no problems.  Social History   Socioeconomic History  . Marital status: Married    Spouse name: Not on file  . Number of children: Not on file  . Years of education: Not on file  . Highest education level: Not on file  Occupational History  . Not on file  Social Needs  . Financial resource strain: Not on file  . Food insecurity:    Worry: Not on file    Inability: Not on file  . Transportation needs:    Medical: Not on file    Non-medical: Not on file  Tobacco Use  . Smoking status: Never Smoker  . Smokeless tobacco: Never Used  Substance and Sexual Activity  . Alcohol use: No  . Drug use: No  . Sexual activity: Yes    Birth control/protection: Pill  Lifestyle  . Physical activity:    Days per week: Not on file    Minutes per session: Not on file  . Stress: Not on file  Relationships  . Social connections:    Talks on phone: Not on file    Gets together: Not on file    Attends religious service: Not on file    Active member of club or organization: Not on file    Attends meetings of clubs or organizations: Not on file    Relationship status: Not on file  . Intimate partner violence:    Fear of current or ex partner: Not on file    Emotionally abused: Not on file    Physically abused: Not on file    Forced sexual activity: Not on file  Other Topics Concern  . Not on file  Social History Narrative  . Not on file   Health Maintenance  Topic Date Due  . HIV Screening  08/19/2003  . TETANUS/TDAP  08/19/2007  . INFLUENZA VACCINE  09/23/2017  . PAP SMEAR  04/15/2018    The following portions of the patient's history were reviewed and updated as appropriate: allergies, current medications, past family history, past medical history, past social history, past surgical history and problem list.  Review of  Systems A comprehensive review of systems was negative.   Objective:    General appearance: alert, cooperative and appears stated age Neck: no adenopathy, no carotid bruit, no JVD, supple, symmetrical, trachea midline and thyroid not enlarged, symmetric, no tenderness/mass/nodules Lungs: clear to auscultation bilaterally Breasts: normal appearance, no masses or tenderness Heart: regular rate and rhythm, S1, S2 normal, no murmur, click, rub or gallop Abdomen: soft, non-tender; bowel sounds normal; no masses,  no organomegaly Pelvic: cervix normal in appearance, external genitalia normal, no adnexal masses or tenderness, no cervical motion tenderness, rectovaginal septum normal, uterus normal size, shape, and consistency and vagina normal without discharge    Assessment:    Healthy female exam. S/P THYROIECTOMY     Plan:    RTC 1 year or as needed. Considering trying for pregancy in next few months. See After Visit Summary for Counseling Recommendations

## 2017-06-03 LAB — CYTOLOGY - PAP

## 2017-08-23 ENCOUNTER — Other Ambulatory Visit: Payer: Self-pay | Admitting: Obstetrics and Gynecology

## 2017-08-23 DIAGNOSIS — O200 Threatened abortion: Secondary | ICD-10-CM

## 2017-08-23 DIAGNOSIS — N926 Irregular menstruation, unspecified: Secondary | ICD-10-CM

## 2017-08-27 ENCOUNTER — Ambulatory Visit (INDEPENDENT_AMBULATORY_CARE_PROVIDER_SITE_OTHER): Payer: BLUE CROSS/BLUE SHIELD

## 2017-08-27 ENCOUNTER — Ambulatory Visit: Payer: BLUE CROSS/BLUE SHIELD | Admitting: Obstetrics and Gynecology

## 2017-08-27 ENCOUNTER — Encounter: Payer: Self-pay | Admitting: Obstetrics and Gynecology

## 2017-08-27 VITALS — BP 137/84 | HR 107 | Ht 66.0 in | Wt 175.0 lb

## 2017-08-27 DIAGNOSIS — N926 Irregular menstruation, unspecified: Secondary | ICD-10-CM

## 2017-08-27 DIAGNOSIS — O200 Threatened abortion: Secondary | ICD-10-CM

## 2017-08-27 LAB — POCT URINE PREGNANCY: PREG TEST UR: POSITIVE — AB

## 2017-08-27 NOTE — Progress Notes (Signed)
  Subjective:     Patient ID: Arby Barrette, female   DOB: 1990/11/21, 27 y.o.   MRN: [id number]  HPI Here for pregnancy confirmation with LMP 07/11/17, giving EGA [redacted]w[redacted]d & EDC 04/17/18. Does report episode of bleeding/spotting for 3 days, none seen in 2 days. Denies any cramping. Has one female child already.  Review of Systems Negative except spotting stated above    Objective:   Physical Exam A&Ox4 Well groomed female in no distress Vitals:   08/27/17 0812  Weight: 175 lb (79.4 kg)  Height: 5\' 6"  (1.676 m)   UPT+  Pelvic u/s done today reveals:  Findings:  No products of conception are identified.  The uterus measures 8.9 x 5.6 x 3.7 cm. It appears homogeneous without evidence of focal masses. The endometrium measures 1.0 mm.  Right Ovary measures 3.2 x 2.1 x 1.9 cm. It is normal in appearance. Left Ovary measures 2.7 x 1.8 x 1.5 cm. It is normal appearance. There is no obvious evidence of a corpus luteal cyst. Survey of the adnexa demonstrates no adnexal masses. There is no free peritoneal fluid in the cul de sac.  Impression: 1. No products of conception are identified. 2. The uterus appears of normal size and contour. 2. The endometrium measures 1.0 mm. 3. Bilateral ovaries appear WNL.  No obvious evidence of a corpus luteal cyst.    Assessment:     Missed menses Miscarriage     Plan:     Hcg levels obtained.  Counseled on causes of spotting at 6 weeks.  Counseled on miscarriage RTC 1 week for labs then 2 weeks after levels are normal.  Rubbie Goostree,CNM

## 2017-08-27 NOTE — Patient Instructions (Signed)

## 2017-08-28 LAB — BETA HCG QUANT (REF LAB): hCG Quant: 22 m[IU]/mL

## 2017-09-03 ENCOUNTER — Encounter: Payer: Self-pay | Admitting: Emergency Medicine

## 2017-09-03 ENCOUNTER — Ambulatory Visit
Admission: EM | Admit: 2017-09-03 | Discharge: 2017-09-03 | Disposition: A | Discharge status: Home / Self Care | Payer: BLUE CROSS/BLUE SHIELD | Attending: Emergency Medicine | Admitting: Emergency Medicine

## 2017-09-03 ENCOUNTER — Other Ambulatory Visit: Payer: Self-pay

## 2017-09-03 ENCOUNTER — Other Ambulatory Visit: Payer: BLUE CROSS/BLUE SHIELD

## 2017-09-03 DIAGNOSIS — J029 Acute pharyngitis, unspecified: Secondary | ICD-10-CM | POA: Diagnosis not present

## 2017-09-03 DIAGNOSIS — H1032 Unspecified acute conjunctivitis, left eye: Secondary | ICD-10-CM

## 2017-09-03 DIAGNOSIS — N926 Irregular menstruation, unspecified: Secondary | ICD-10-CM | POA: Diagnosis not present

## 2017-09-03 LAB — RAPID STREP SCREEN (MED CTR MEBANE ONLY): Streptococcus, Group A Screen (Direct): NEGATIVE

## 2017-09-03 MED ORDER — CIPROFLOXACIN HCL 0.3 % OP SOLN: OPHTHALMIC | 0 refills | Status: DC

## 2017-09-03 MED ORDER — FLUTICASONE PROPIONATE 50 MCG/ACT NA SUSP: 2.0000 | Freq: Every day | NASAL | 0 refills | Status: DC

## 2017-09-03 MED ORDER — IBUPROFEN 600 MG PO TABS: 600.0000 mg | ORAL_TABLET | Freq: Four times a day (QID) | ORAL | 0 refills | Status: DC | PRN

## 2017-09-03 NOTE — ED Triage Notes (Signed)
Patient c/o HAs and sore throat that started on Wed.

## 2017-09-03 NOTE — ED Provider Notes (Signed)
HPI  SUBJECTIVE:  Kristen Powers is a 27 y.o. female who presents with sore throat for the past 3 days.  She reports body aches, nasal congestion, rhinorrhea.  No change in her baseline postnasal drip.  No cough.  She reports headaches with standing up, but also reports decreased p.o. intake secondary to the sore throat.  No fevers, rash, abdominal pain, voice changes, drooling, trismus, neck stiffness, difficulty breathing.  No allergy or GERD symptoms.  No strep or mono contacts.  She also reports left eye redness and states that her eye was matted shut this morning.  Her daughter currently has pinkeye.  No visual changes, eye pain, pain with EOMs, photophobia, halos around lights.  She does not wear glasses or contacts.  She denies rubbing her eye.  She has tried salt water gargles, Tylenol sinus, and apple cider vinegar with warm water.  Apple cider vinegar seems to help.  Symptoms worse with swallowing.  No antibiotics in the past month.  She took Tylenol sinus within 6 to 8 hours of evaluation.  She also tried allergy eyedrops in her eye which made her symptoms worse.  No alleviating factors.  She has a past medical history of recurrent strep throat as a child, hypothyroidism status post thyroidectomy.  No history of mono, diabetes, hypertension.  LMP: 07/11/2017.  Patient had a miscarriage with no products of conception found on ultrasound on 08/27/2017.  Has repeat beta quant to be drawn today.  PMD: Melody Shambley.  Past Medical History:  Diagnosis Date  . Breastfeeding (infant) Jul 20, 2014  . Headache   . Thyroid disease     Past Surgical History:  Procedure Laterality Date  . THYROIDECTOMY N/A 08/06/2014   Procedure: THYROIDECTOMY;  Surgeon: Carloyn Manner, MD;  Location: [facility] ORS;  Service: ENT;  Laterality: N/A;  . TYMPANOSTOMY TUBE PLACEMENT      Family History  Problem Relation Age of Onset  . Cancer Mother        thyroid  . Diabetes Father     Social History    Tobacco Use  . Smoking status: Never Smoker  . Smokeless tobacco: Never Used  Substance Use Topics  . Alcohol use: No  . Drug use: No    No current facility-administered medications for this encounter.   Current Outpatient Medications:  .  levothyroxine (SYNTHROID, LEVOTHROID) 137 MCG tablet, Take 137 mcg by mouth daily before breakfast., Disp: , Rfl:  .  ciprofloxacin (CILOXAN) 0.3 % ophthalmic solution, 1-2 drops in affected eye 4 times/day x 5 days, Disp: 5 mL, Rfl: 0 .  fluticasone (FLONASE) 50 MCG/ACT nasal spray, Place 2 sprays into both nostrils daily., Disp: 16 g, Rfl: 0 .  ibuprofen (ADVIL,MOTRIN) 600 MG tablet, Take 1 tablet (600 mg total) by mouth every 6 (six) hours as needed., Disp: 30 tablet, Rfl: 0  Allergies  Allergen Reactions  . Latex Swelling and Rash  . Topamax [Topiramate]     Other reaction(s): Unknown     ROS  As noted in HPI.   Physical Exam  BP 115/70 (BP Location: Left Arm)   Pulse 66   Temp 98.3 F (36.8 C) (Oral)   Resp 14   Ht 5\' 6"  (1.676 m)   Wt 170 lb (77.1 kg)   LMP 07/11/2017 (Exact Date)   SpO2 100%   Breastfeeding? No Comment: Patient had a recent misscariage  BMI 27.44 kg/m   Constitutional: Well developed, well nourished, no acute distress Eyes:  EOMI, PERRLA.  Conjunctival injection left eye.  No direct or consensual photophobia.  No discharge.  No periorbital erythema, edema.  No pain with extraocular movements.  No foreign body seen on lid eversion. HENT: Normocephalic, atraumatic,mucus membranes moist.  No nasal congestion.  Normal turbinates.  Erythematous oropharynx, tonsils normal without exudates.  Uvula midline.  Positive postnasal drip. : Positive anterior cervical lymphadenopathy Respiratory: Normal inspiratory effort Cardiovascular: Normal rate regular rhythm, no murmurs GI: nondistended, no splenomegaly. skin: No rash, skin intact Musculoskeletal: no deformities Neurologic: Alert & oriented x 3, no focal neuro  deficits Psychiatric: Speech and behavior appropriate   ED Course   Medications - No data to display  Orders Placed This Encounter  Procedures  . Rapid Strep Screen ([facility] & [facility] ONLY)    Standing Status:   Standing    Number of Occurrences:   1  . Culture, group A strep    Standing Status:   Standing    Number of Occurrences:   1    Results for orders placed or performed during the hospital encounter of 09/03/17 (from the past 24 hour(s))  Rapid Strep Screen ([facility] ONLY)     Status: None   Collection Time: 09/03/17  8:23 AM  Result Value Ref Range   Streptococcus, Group A Screen (Direct) NEGATIVE NEGATIVE   No results found.  ED Clinical Impression  Pharyngitis, unspecified etiology  Acute conjunctivitis of left eye, unspecified acute conjunctivitis type   ED Assessment/Plan  Outside records reviewed.  As noted in HPI.  Rapid strep negative.  Will provide supportive treatment until throat culture is resulted.  Discussed this with patient.  Advised her that we will call her and call in the appropriate antibiotics if her throat culture comes back positive.  In the meantime, sent home with ibuprofen 600 mg to take with 1 g of Tylenol, Flonase, Benadryl/Maalox mixture.  Suspect infectious conjunctivitis, viral versus bacterial.  Will send home with antibiotic eyedrops if not getting better in 24 hours.  Doubt corneal abrasion.  Discussed labs,  MDM, treatment plan, and plan for follow-up with patient. Discussed sn/sx that should prompt return to the ED. patient agrees with plan.   Meds ordered this encounter  Medications  . ciprofloxacin (CILOXAN) 0.3 % ophthalmic solution    Sig: 1-2 drops in affected eye 4 times/day x 5 days    Dispense:  5 mL    Refill:  0  . fluticasone (FLONASE) 50 MCG/ACT nasal spray    Sig: Place 2 sprays into both nostrils daily.    Dispense:  16 g    Refill:  0  . ibuprofen (ADVIL,MOTRIN) 600 MG tablet    Sig: Take 1 tablet (600 mg total)  by mouth every 6 (six) hours as needed.    Dispense:  30 tablet    Refill:  0    *This clinic note was created using [employer]. Therefore, there may be occasional mistakes despite careful proofreading.   ?   Melynda Ripple, MD 09/03/17 [phone]

## 2017-09-03 NOTE — Discharge Instructions (Addendum)
your rapid strep was negative today, so we have sent off a throat culture.  We will contact you and call in the appropriate antibiotics if your culture comes back positive for an infection requiring antibiotic treatment.  Give [place] a working phone number.   1 gram of Tylenol and 600 mg ibuprofen together 3-4 times a day as needed for pain.  Make sure you drink plenty of extra fluids.  Some people find salt water gargles and  Traditional Medicinal's "Throat Coat" tea helpful. Take 5 mL of liquid Benadryl and 5 mL of Maalox. Mix it together, and then hold it in your mouth for as long as you can and then swallow. You may do this 4 times a day.    As for the eye, warm compresses.  Wash your hands frequently.  Try not to touch your eye.  Start the antibiotic eyedrops tomorrow if you are still having symptoms.  Go to www.goodrx.com to look up your medications. This will give you a list of where you can find your prescriptions at the most affordable prices. Or ask the pharmacist what the cash price is, or if they have any other discount programs available to help make your medication more affordable. This can be less expensive than what you would pay with insurance.

## 2017-09-04 LAB — BETA HCG QUANT (REF LAB): HCG QUANT: 2 m[IU]/mL

## 2017-09-05 LAB — CULTURE, GROUP A STREP (THRC)

## 2017-09-07 ENCOUNTER — Encounter: Payer: BLUE CROSS/BLUE SHIELD | Admitting: Obstetrics and Gynecology

## 2017-12-07 DIAGNOSIS — H6981 Other specified disorders of Eustachian tube, right ear: Secondary | ICD-10-CM | POA: Diagnosis not present

## 2017-12-07 DIAGNOSIS — Z85850 Personal history of malignant neoplasm of thyroid: Secondary | ICD-10-CM | POA: Diagnosis not present

## 2017-12-09 ENCOUNTER — Other Ambulatory Visit: Payer: Self-pay

## 2017-12-14 ENCOUNTER — Ambulatory Visit: Payer: BLUE CROSS/BLUE SHIELD | Admitting: Obstetrics and Gynecology

## 2017-12-14 ENCOUNTER — Other Ambulatory Visit: Payer: Self-pay | Admitting: Obstetrics and Gynecology

## 2017-12-14 ENCOUNTER — Encounter: Payer: Self-pay | Admitting: Obstetrics and Gynecology

## 2017-12-14 ENCOUNTER — Other Ambulatory Visit (INDEPENDENT_AMBULATORY_CARE_PROVIDER_SITE_OTHER): Payer: BLUE CROSS/BLUE SHIELD

## 2017-12-14 VITALS — BP 131/82 | HR 77 | Ht 66.0 in | Wt 177.6 lb

## 2017-12-14 DIAGNOSIS — N926 Irregular menstruation, unspecified: Secondary | ICD-10-CM

## 2017-12-14 DIAGNOSIS — N8312 Corpus luteum cyst of left ovary: Secondary | ICD-10-CM

## 2017-12-14 DIAGNOSIS — O3411 Maternal care for benign tumor of corpus uteri, first trimester: Secondary | ICD-10-CM | POA: Diagnosis not present

## 2017-12-14 DIAGNOSIS — Z3A08 8 weeks gestation of pregnancy: Secondary | ICD-10-CM | POA: Diagnosis not present

## 2017-12-14 DIAGNOSIS — Z3201 Encounter for pregnancy test, result positive: Secondary | ICD-10-CM

## 2017-12-14 LAB — POCT URINE PREGNANCY: Preg Test, Ur: POSITIVE — AB

## 2017-12-14 NOTE — Progress Notes (Signed)
  Subjective:     Patient ID: Arby Barrette, female   DOB: 11-13-1990, 27 y.o.   MRN: [id number]  HPI Here for pregnancy confirmation with normal LMP 10/13/17, giving EDC 07/20/18 and EGA 9w. Had home UPT+ on 11/08/17. Denies any bleeding. Does have some nausea.   Review of Systems  All other systems reviewed and are negative.      Objective:   Physical Exam A&Ox4 Well groomed female in no distress Blood pressure 131/82, pulse 77, height 5\' 6"  (1.676 m), weight 177 lb 9.6 oz (80.6 kg), last menstrual period 10/13/2017. Body mass index is 28.67 kg/m.  UPT+   pelvic u/s today reveals: Nelda Marseille intrauterine pregnancy is visualized with a CRL consistent with 8 6/[redacted] weeks gestation, giving an (U/S) EDD of 07/20/18. The (U/S) EDD is consistent with the clinically established (LMP) EDD of 07/20/18.  FHR: 171 BPM CRL measurement: 21.8 mm Yolk sac and early anatomy is normal.  Right Ovary measures 2.9 x 2.1 x 2.0 cm. It is normal in appearance. Left Ovary measures 3.4 x 2.9 x 1.7 cm. It is normal appearance. There is evidence of a corpus luteal cyst in the left ovary. Survey of the adnexa demonstrates no adnexal masses. There is no free peritoneal fluid in the cul de sac.  Assessment:     Missed menses    Plan:    congratulated on findings. RTC 1 week for New OB nurse intake and labs, then in 3 weeks for New OB PE with me.     Yanelis Osika,CNM

## 2017-12-21 ENCOUNTER — Ambulatory Visit (INDEPENDENT_AMBULATORY_CARE_PROVIDER_SITE_OTHER): Payer: BLUE CROSS/BLUE SHIELD | Admitting: Obstetrics and Gynecology

## 2017-12-21 VITALS — BP 106/72 | HR 96 | Ht 66.0 in | Wt 179.8 lb

## 2017-12-21 DIAGNOSIS — Z202 Contact with and (suspected) exposure to infections with a predominantly sexual mode of transmission: Secondary | ICD-10-CM

## 2017-12-21 DIAGNOSIS — Z3481 Encounter for supervision of other normal pregnancy, first trimester: Secondary | ICD-10-CM | POA: Diagnosis not present

## 2017-12-21 NOTE — Patient Instructions (Signed)
How a Baby Grows During Pregnancy Pregnancy begins when a female's sperm enters a female's egg (fertilization). This happens in one of the tubes (fallopian tubes) that connect the ovaries to the womb (uterus). The fertilized egg is called an embryo until it reaches 10 weeks. From 10 weeks until birth, it is called a fetus. The fertilized egg moves down the fallopian tube to the uterus. Then it implants into the lining of the uterus and begins to grow. The developing fetus receives oxygen and nutrients through the pregnant woman's bloodstream and the tissues that grow (placenta) to support the fetus. The placenta is the life support system for the fetus. It provides nutrition and removes waste. Learning as much as you can about your pregnancy and how your baby is developing can help you enjoy the experience. It can also make you aware of when there might be a problem and when to ask questions. How long does a typical pregnancy last? A pregnancy usually lasts 280 days, or about 40 weeks. Pregnancy is divided into three trimesters:  First trimester: 0-13 weeks.  Second trimester: 14-27 weeks.  Third trimester: 28-40 weeks.  The day when your baby is considered ready to be born (full term) is your estimated date of delivery. How does my baby develop month by month? First month  The fertilized egg attaches to the inside of the uterus.  Some cells will form the placenta. Others will form the fetus.  The arms, legs, brain, spinal cord, lungs, and heart begin to develop.  At the end of the first month, the heart begins to beat.  Second month  The bones, inner ear, eyelids, hands, and feet form.  The genitals develop.  By the end of 8 weeks, all major organs are developing.  Third month  All of the internal organs are forming.  Teeth develop below the gums.  Bones and muscles begin to grow. The spine can flex.  The skin is transparent.  Fingernails and toenails begin to form.  Arms  and legs continue to grow longer, and hands and feet develop.  The fetus is about 3 in (7.6 cm) long.  Fourth month  The placenta is completely formed.  The external sex organs, neck, outer ear, eyebrows, eyelids, and fingernails are formed.  The fetus can hear, swallow, and move its arms and legs.  The kidneys begin to produce urine.  The skin is covered with a white waxy coating (vernix) and very fine hair (lanugo).  Fifth month  The fetus moves around more and can be felt for the first time (quickening).  The fetus starts to sleep and wake up and may begin to suck its finger.  The nails grow to the end of the fingers.  The organ in the digestive system that makes bile (gallbladder) functions and helps to digest the nutrients.  If your baby is a girl, eggs are present in her ovaries. If your baby is a boy, testicles start to move down into his scrotum.  Sixth month  The lungs are formed, but the fetus is not yet able to breathe.  The eyes open. The brain continues to develop.  Your baby has fingerprints and toe prints. Your baby's hair grows thicker.  At the end of the second trimester, the fetus is about 9 in (22.9 cm) long.  Seventh month  The fetus kicks and stretches.  The eyes are developed enough to sense changes in light.  The hands can make a grasping motion.  The  fetus responds to sound.  Eighth month  All organs and body systems are fully developed and functioning.  Bones harden and taste buds develop. The fetus may hiccup.  Certain areas of the brain are still developing. The skull remains soft.  Ninth month  The fetus gains about  lb (0.23 kg) each week.  The lungs are fully developed.  Patterns of sleep develop.  The fetus's head typically moves into a head-down position (vertex) in the uterus to prepare for birth. If the buttocks move into a vertex position instead, the baby is breech.  The fetus weighs 6-9 lbs (2.72-4.08 kg) and is  19-20 in (48.26-50.8 cm) long.  What can I do to have a healthy pregnancy and help my baby develop? Eating and Drinking  Eat a healthy diet. ? Talk with your health care provider to make sure that you are getting the nutrients that you and your baby need. ? Visit www.choosemyplate.gov to learn about creating a healthy diet.  Gain a healthy amount of weight during pregnancy as advised by your health care provider. This is usually 25-35 pounds. You may need to: ? Gain more if you were underweight before getting pregnant or if you are pregnant with more than one baby. ? Gain less if you were overweight or obese when you got pregnant.  Medicines and Vitamins  Take prenatal vitamins as directed by your health care provider. These include vitamins such as folic acid, iron, calcium, and vitamin D. They are important for healthy development.  Take medicines only as directed by your health care provider. Read labels and ask a pharmacist or your health care provider whether over-the-counter medicines, supplements, and prescription drugs are safe to take during pregnancy.  Activities  Be physically active as advised by your health care provider. Ask your health care provider to recommend activities that are safe for you to do, such as walking or swimming.  Do not participate in strenuous or extreme sports.  Lifestyle  Do not drink alcohol.  Do not use any tobacco products, including cigarettes, chewing tobacco, or electronic cigarettes. If you need help quitting, ask your health care provider.  Do not use illegal drugs.  Safety  Avoid exposure to mercury, lead, or other heavy metals. Ask your health care provider about common sources of these heavy metals.  Avoid listeria infection during pregnancy. Follow these precautions: ? Do not eat soft cheeses or deli meats. ? Do not eat hot dogs unless they have been warmed up to the point of steaming, such as in the microwave oven. ? Do not  drink unpasteurized milk.  Avoid toxoplasmosis infection during pregnancy. Follow these precautions: ? Do not change your cat's litter box, if you have a cat. Ask someone else to do this for you. ? Wear gardening gloves while working in the yard.  General Instructions  Keep all follow-up visits as directed by your health care provider. This is important. This includes prenatal care and screening tests.  Manage any chronic health conditions. Work closely with your health care provider to keep conditions, such as diabetes, under control.  How do I know if my baby is developing well? At each prenatal visit, your health care provider will do several different tests to check on your health and keep track of your baby's development. These include:  Fundal height. ? Your health care provider will measure your growing belly from top to bottom using a tape measure. ? Your health care provider will also feel your belly   to determine your baby's position.  Heartbeat. ? An ultrasound in the first trimester can confirm pregnancy and show a heartbeat, depending on how far along you are. ? Your health care provider will check your baby's heart rate at every prenatal visit. ? As you get closer to your delivery date, you may have regular fetal heart rate monitoring to make sure that your baby is not in distress.  Second trimester ultrasound. ? This ultrasound checks your baby's development. It also indicates your baby's gender.  What should I do if I have concerns about my baby's development? Always talk with your health care provider about any concerns that you may have. This information is not intended to replace advice given to you by your health care provider. Make sure you discuss any questions you have with your health care provider. Document Released: 07/29/2007 Document Revised: 07/18/2015 Document Reviewed: 07/19/2013 Elsevier Interactive Patient Education  2018 [place] of Pregnancy The first trimester of pregnancy is from week 1 until the end of week 13 (months 1 through 3). A week after a sperm fertilizes an egg, the egg will implant on the wall of the uterus. This embryo will begin to develop into a baby. Genes from you and your partner will form the baby. The female genes will determine whether the baby will be a boy or a girl. At 6-8 weeks, the eyes and face will be formed, and the heartbeat can be seen on ultrasound. At the end of 12 weeks, all the baby's organs will be formed. Now that you are pregnant, you will want to do everything you can to have a healthy baby. Two of the most important things are to get good prenatal care and to follow your health care provider's instructions. Prenatal care is all the medical care you receive before the baby's birth. This care will help prevent, find, and treat any problems during the pregnancy and childbirth. Body changes during your first trimester Your body goes through many changes during pregnancy. The changes vary from woman to woman.  You may gain or lose a couple of pounds at first.  You may feel sick to your stomach (nauseous) and you may throw up (vomit). If the vomiting is uncontrollable, call your health care provider.  You may tire easily.  You may develop headaches that can be relieved by medicines. All medicines should be approved by your health care provider.  You may urinate more often. Painful urination may mean you have a bladder infection.  You may develop heartburn as a result of your pregnancy.  You may develop constipation because certain hormones are causing the muscles that push stool through your intestines to slow down.  You may develop hemorrhoids or swollen veins (varicose veins).  Your breasts may begin to grow larger and become tender. Your nipples may stick out more, and the tissue that surrounds them (areola) may become darker.  Your gums may bleed and may be sensitive to  brushing and flossing.  Dark spots or blotches (chloasma, mask of pregnancy) may develop on your face. This will likely fade after the baby is born.  Your menstrual periods will stop.  You may have a loss of appetite.  You may develop cravings for certain kinds of food.  You may have changes in your emotions from day to day, such as being excited to be pregnant or being concerned that something may go wrong with the pregnancy and baby.  You may have more  vivid and strange dreams.  You may have changes in your hair. These can include thickening of your hair, rapid growth, and changes in texture. Some women also have hair loss during or after pregnancy, or hair that feels dry or thin. Your hair will most likely return to normal after your baby is born.  What to expect at prenatal visits During a routine prenatal visit:  You will be weighed to make sure you and the baby are growing normally.  Your blood pressure will be taken.  Your abdomen will be measured to track your baby's growth.  The fetal heartbeat will be listened to between weeks 10 and 14 of your pregnancy.  Test results from any previous visits will be discussed.  Your health care provider may ask you:  How you are feeling.  If you are feeling the baby move.  If you have had any abnormal symptoms, such as leaking fluid, bleeding, severe headaches, or abdominal cramping.  If you are using any tobacco products, including cigarettes, chewing tobacco, and electronic cigarettes.  If you have any questions.  Other tests that may be performed during your first trimester include:  Blood tests to find your blood type and to check for the presence of any previous infections. The tests will also be used to check for low iron levels (anemia) and protein on red blood cells (Rh antibodies). Depending on your risk factors, or if you previously had diabetes during pregnancy, you may have tests to check for high blood sugar that  affects pregnant women (gestational diabetes).  Urine tests to check for infections, diabetes, or protein in the urine.  An ultrasound to confirm the proper growth and development of the baby.  Fetal screens for spinal cord problems (spina bifida) and Down syndrome.  HIV (human immunodeficiency virus) testing. Routine prenatal testing includes screening for HIV, unless you choose not to have this test.  You may need other tests to make sure you and the baby are doing well.  Follow these instructions at home: Medicines  Follow your health care provider's instructions regarding medicine use. Specific medicines may be either safe or unsafe to take during pregnancy.  Take a prenatal vitamin that contains at least 600 micrograms (mcg) of folic acid.  If you develop constipation, try taking a stool softener if your health care provider approves. Eating and drinking  Eat a balanced diet that includes fresh fruits and vegetables, whole grains, good sources of protein such as meat, eggs, or tofu, and low-fat dairy. Your health care provider will help you determine the amount of weight gain that is right for you.  Avoid raw meat and uncooked cheese. These carry germs that can cause birth defects in the baby.  Eating four or five small meals rather than three large meals a day may help relieve nausea and vomiting. If you start to feel nauseous, eating a few soda crackers can be helpful. Drinking liquids between meals, instead of during meals, also seems to help ease nausea and vomiting.  Limit foods that are high in fat and processed sugars, such as fried and sweet foods.  To prevent constipation: ? Eat foods that are high in fiber, such as fresh fruits and vegetables, whole grains, and beans. ? Drink enough fluid to keep your urine clear or pale yellow. Activity  Exercise only as directed by your health care provider. Most women can continue their usual exercise routine during pregnancy. Try  to exercise for 30 minutes at least 5 days  a week. Exercising will help you: ? Control your weight. ? Stay in shape. ? Be prepared for labor and delivery.  Experiencing pain or cramping in the lower abdomen or lower back is a good sign that you should stop exercising. Check with your health care provider before continuing with normal exercises.  Try to avoid standing for long periods of time. Move your legs often if you must stand in one place for a long time.  Avoid heavy lifting.  Wear low-heeled shoes and practice good posture.  You may continue to have sex unless your health care provider tells you not to. Relieving pain and discomfort  Wear a good support bra to relieve breast tenderness.  Take warm sitz baths to soothe any pain or discomfort caused by hemorrhoids. Use hemorrhoid cream if your health care provider approves.  Rest with your legs elevated if you have leg cramps or low back pain.  If you develop varicose veins in your legs, wear support hose. Elevate your feet for 15 minutes, 3-4 times a day. Limit salt in your diet. Prenatal care  Schedule your prenatal visits by the twelfth week of pregnancy. They are usually scheduled monthly at first, then more often in the last 2 months before delivery.  Write down your questions. Take them to your prenatal visits.  Keep all your prenatal visits as told by your health care provider. This is important. Safety  Wear your seat belt at all times when driving.  Make a list of emergency phone numbers, including numbers for family, friends, the hospital, and police and fire departments. General instructions  Ask your health care provider for a referral to a local prenatal education class. Begin classes no later than the beginning of month 6 of your pregnancy.  Ask for help if you have counseling or nutritional needs during pregnancy. Your health care provider can offer advice or refer you to specialists for help with various  needs.  Do not use hot tubs, steam rooms, or saunas.  Do not douche or use tampons or scented sanitary pads.  Do not cross your legs for long periods of time.  Avoid cat litter boxes and soil used by cats. These carry germs that can cause birth defects in the baby and possibly loss of the fetus by miscarriage or stillbirth.  Avoid all smoking, herbs, alcohol, and medicines not prescribed by your health care provider. Chemicals in these products affect the formation and growth of the baby.  Do not use any products that contain nicotine or tobacco, such as cigarettes and e-cigarettes. If you need help quitting, ask your health care provider. You may receive counseling support and other resources to help you quit.  Schedule a dentist appointment. At home, brush your teeth with a soft toothbrush and be gentle when you floss. Contact a health care provider if:  You have dizziness.  You have mild pelvic cramps, pelvic pressure, or nagging pain in the abdominal area.  You have persistent nausea, vomiting, or diarrhea.  You have a bad smelling vaginal discharge.  You have pain when you urinate.  You notice increased swelling in your face, hands, legs, or ankles.  You are exposed to fifth disease or chickenpox.  You are exposed to [place] measles (rubella) and have never had it. Get help right away if:  You have a fever.  You are leaking fluid from your vagina.  You have spotting or bleeding from your vagina.  You have severe abdominal cramping or pain.  You have rapid weight gain or loss.  You vomit blood or material that looks like coffee grounds.  You develop a severe headache.  You have shortness of breath.  You have any kind of trauma, such as from a fall or a car accident. Summary  The first trimester of pregnancy is from week 1 until the end of week 13 (months 1 through 3).  Your body goes through many changes during pregnancy. The changes vary from woman to  woman.  You will have routine prenatal visits. During those visits, your health care provider will examine you, discuss any test results you may have, and talk with you about how you are feeling. This information is not intended to replace advice given to you by your health care provider. Make sure you discuss any questions you have with your health care provider. Document Released: 02/03/2001 Document Revised: 01/22/2016 Document Reviewed: 01/22/2016 Elsevier Interactive Patient Education  2018 [employer].  Commonly Asked Questions During Pregnancy  Cats: A parasite can be excreted in cat feces.  To avoid exposure you need to have another person empty the little box.  If you must empty the litter box you will need to wear gloves.  Wash your hands after handling your cat.  This parasite can also be found in raw or undercooked meat so this should also be avoided.  Colds, Sore Throats, Flu: Please check your medication sheet to see what you can take for symptoms.  If your symptoms are unrelieved by these medications please call the office.  Dental Work: Most any dental work [employer] recommends is permitted.  X-rays should only be taken during the first trimester if absolutely necessary.  Your abdomen should be shielded with a lead apron during all x-rays.  Please notify your provider prior to receiving any x-rays.  Novocaine is fine; gas is not recommended.  If your dentist requires a note from [place] prior to dental work please call the office and we will provide one for you.  Exercise: Exercise is an important part of staying healthy during your pregnancy.  You may continue most exercises you were accustomed to prior to pregnancy.  Later in your pregnancy you will most likely notice you have difficulty with activities requiring balance like riding a bicycle.  It is important that you listen to your body and avoid activities that put you at a higher risk of falling.  Adequate rest and staying well  hydrated are a must!  If you have questions about the safety of specific activities ask your provider.    Exposure to Children with illness: Try to avoid obvious exposure; report any symptoms to [place] when noted,  If you have chicken pos, red measles or mumps, you should be immune to these diseases.   Please do not take any vaccines while pregnant unless you have checked with your OB provider.  Fetal Movement: After 28 weeks we recommend you do "kick counts" twice daily.  Lie or sit down in a calm quiet environment and count your baby movements "kicks".  You should feel your baby at least 10 times per hour.  If you have not felt 10 kicks within the first hour get up, walk around and have something sweet to eat or drink then repeat for an additional hour.  If count remains less than 10 per hour notify your provider.  Fumigating: Follow your pest control agent's advice as to how long to stay out of your home.  Ventilate the area well  before re-entering.  Hemorrhoids:   Most over-the-counter preparations can be used during pregnancy.  Check your medication to see what is safe to use.  It is important to use a stool softener or fiber in your diet and to drink lots of liquids.  If hemorrhoids seem to be getting worse please call the office.   Hot Tubs:  Hot tubs Jacuzzis and saunas are not recommended while pregnant.  These increase your internal body temperature and should be avoided.  Intercourse:  Sexual intercourse is safe during pregnancy as long as you are comfortable, unless otherwise advised by your provider.  Spotting may occur after intercourse; report any bright red bleeding that is heavier than spotting.  Labor:  If you know that you are in labor, please go to the hospital.  If you are unsure, please call the office and let us help you decide what to do.  Lifting, straining, etc:  If your job requires heavy lifting or straining please check with your provider for any limitations.  Generally, you  should not lift items heavier than that you can lift simply with your hands and arms (no back muscles)  Painting:  Paint fumes do not harm your pregnancy, but may make you ill and should be avoided if possible.  Latex or water based paints have less odor than oils.  Use adequate ventilation while painting.  Permanents & Hair Color:  Chemicals in hair dyes are not recommended as they cause increase hair dryness which can increase hair loss during pregnancy.  " Highlighting" and permanents are allowed.  Dye may be absorbed differently and permanents may not hold as well during pregnancy.  Sunbathing:  Use a sunscreen, as skin burns easily during pregnancy.  Drink plenty of fluids; avoid over heating.  Tanning Beds:  Because their possible side effects are still unknown, tanning beds are not recommended.  Ultrasound Scans:  Routine ultrasounds are performed at approximately 20 weeks.  You will be able to see your baby's general anatomy an if you would like to know the gender this can usually be determined as well.  If it is questionable when you conceived you may also receive an ultrasound early in your pregnancy for dating purposes.  Otherwise ultrasound exams are not routinely performed unless there is a medical necessity.  Although you can request a scan we ask that you pay for it when conducted because insurance does not cover " patient request" scans.  Work: If your pregnancy proceeds without complications you may work until your due date, unless your physician or employer advises otherwise.  Round Ligament Pain/Pelvic Discomfort:  Sharp, shooting pains not associated with bleeding are fairly common, usually occurring in the second trimester of pregnancy.  They tend to be worse when standing up or when you remain standing for long periods of time.  These are the result of pressure of certain pelvic ligaments called "round ligaments".  Rest, Tylenol and heat seem to be the most effective relief.  As  the womb and fetus grow, they rise out of the pelvis and the discomfort improves.  Please notify the office if your pain seems different than that described.  It may represent a more serious condition.

## 2017-12-21 NOTE — Progress Notes (Signed)
Kristen Powers presents for NOB nurse interview visit.  Pregnancy confirmation done 12/14/2017.  G3.  P1.  LMP 10/13/2017.  EDD 07/20/2018. Dating scan done 12/14/2017 8 weeks and 6 days.  Pregnancy education material explained and given.  0 Cats in the home.  NOB labs ordered. HIV and drug screen were explained and ordered.  PNV encouraged.  Genetic screening options discussed.  Genetic testing: Declined.  Patient to return in 3 weeks for NOB physical.

## 2017-12-22 LAB — CBC WITH DIFFERENTIAL/PLATELET
BASOS: 1 %
Basophils Absolute: 0.1 10*3/uL (ref 0.0–0.2)
EOS (ABSOLUTE): 0.2 10*3/uL (ref 0.0–0.4)
Eos: 2 %
Hematocrit: 40.4 % (ref 34.0–46.6)
Hemoglobin: 13.8 g/dL (ref 11.1–15.9)
IMMATURE GRANS (ABS): 0.1 10*3/uL (ref 0.0–0.1)
Immature Granulocytes: 1 %
LYMPHS: 20 %
Lymphocytes Absolute: 2.1 10*3/uL (ref 0.7–3.1)
MCH: 32.4 pg (ref 26.6–33.0)
MCHC: 34.2 g/dL (ref 31.5–35.7)
MCV: 95 fL (ref 79–97)
Monocytes Absolute: 0.7 10*3/uL (ref 0.1–0.9)
Monocytes: 7 %
Neutrophils Absolute: 7.3 10*3/uL — ABNORMAL HIGH (ref 1.4–7.0)
Neutrophils: 69 %
PLATELETS: 283 10*3/uL (ref 150–450)
RBC: 4.26 x10E6/uL (ref 3.77–5.28)
RDW: 12 % — AB (ref 12.3–15.4)
WBC: 10.5 10*3/uL (ref 3.4–10.8)

## 2017-12-22 LAB — MONITOR DRUG PROFILE 14(MW)
Amphetamine Scrn, Ur: NEGATIVE ng/mL
BARBITURATE SCREEN URINE: NEGATIVE ng/mL
BENZODIAZEPINE SCREEN, URINE: NEGATIVE ng/mL
Buprenorphine, Urine: NEGATIVE ng/mL
CANNABINOIDS UR QL SCN: NEGATIVE ng/mL
COCAINE(METAB.)SCREEN, URINE: NEGATIVE ng/mL
CREATININE(CRT), U: 48.8 mg/dL (ref 20.0–300.0)
FENTANYL, URINE: NEGATIVE pg/mL
MEPERIDINE SCREEN, URINE: NEGATIVE ng/mL
METHADONE SCREEN, URINE: NEGATIVE ng/mL
OPIATE SCREEN URINE: NEGATIVE ng/mL
OXYCODONE+OXYMORPHONE UR QL SCN: NEGATIVE ng/mL
Ph of Urine: 7.6 (ref 4.5–8.9)
Phencyclidine Qn, Ur: NEGATIVE ng/mL
Propoxyphene Scrn, Ur: NEGATIVE ng/mL
SPECIFIC GRAVITY: 1.009
TRAMADOL SCREEN, URINE: NEGATIVE ng/mL

## 2017-12-22 LAB — HIV ANTIBODY (ROUTINE TESTING W REFLEX): HIV SCREEN 4TH GENERATION: NONREACTIVE

## 2017-12-22 LAB — URINALYSIS, ROUTINE W REFLEX MICROSCOPIC
Bilirubin, UA: NEGATIVE
GLUCOSE, UA: NEGATIVE
KETONES UA: NEGATIVE
Leukocytes, UA: NEGATIVE
NITRITE UA: NEGATIVE
Protein, UA: NEGATIVE
RBC, UA: NEGATIVE
SPEC GRAV UA: 1.007 (ref 1.005–1.030)
UUROB: 0.2 mg/dL (ref 0.2–1.0)
pH, UA: 8 — ABNORMAL HIGH (ref 5.0–7.5)

## 2017-12-22 LAB — RUBELLA SCREEN: Rubella Antibodies, IGG: 1.65 index (ref >0.99)

## 2017-12-22 LAB — GC/CHLAMYDIA PROBE AMP
CHLAMYDIA, DNA PROBE: NEGATIVE
NEISSERIA GONORRHOEAE BY PCR: NEGATIVE

## 2017-12-22 LAB — HEPATITIS B SURFACE ANTIGEN: Hepatitis B Surface Ag: NEGATIVE

## 2017-12-22 LAB — VARICELLA ZOSTER ANTIBODY, IGG: VARICELLA: 1357 {index} (ref >165)

## 2017-12-22 LAB — ABO AND RH: Rh Factor: NEGATIVE

## 2017-12-22 LAB — RPR: RPR Ser Ql: NONREACTIVE

## 2017-12-22 LAB — NICOTINE SCREEN, URINE: COTININE UR QL SCN: NEGATIVE ng/mL

## 2017-12-22 LAB — ANTIBODY SCREEN
ANTIBODY SCREEN: NEGATIVE
ANTIBODY SCREEN: NEGATIVE

## 2017-12-22 LAB — HGB SOLU + RFLX FRAC: SICKLE SOLUBILITY TEST - HGBRFX: NEGATIVE

## 2017-12-25 LAB — URINE CULTURE, OB REFLEX

## 2017-12-25 LAB — CULTURE, OB URINE

## 2017-12-28 ENCOUNTER — Other Ambulatory Visit: Payer: Self-pay | Admitting: Obstetrics and Gynecology

## 2017-12-28 DIAGNOSIS — R8271 Bacteriuria: Secondary | ICD-10-CM

## 2018-01-11 ENCOUNTER — Ambulatory Visit (INDEPENDENT_AMBULATORY_CARE_PROVIDER_SITE_OTHER): Payer: BLUE CROSS/BLUE SHIELD | Admitting: Obstetrics and Gynecology

## 2018-01-11 VITALS — BP 126/80 | HR 83 | Wt 178.1 lb

## 2018-01-11 DIAGNOSIS — Z3A12 12 weeks gestation of pregnancy: Secondary | ICD-10-CM

## 2018-01-11 DIAGNOSIS — Z23 Encounter for immunization: Secondary | ICD-10-CM | POA: Diagnosis not present

## 2018-01-11 DIAGNOSIS — Z3492 Encounter for supervision of normal pregnancy, unspecified, second trimester: Secondary | ICD-10-CM

## 2018-01-11 DIAGNOSIS — Z3481 Encounter for supervision of other normal pregnancy, first trimester: Secondary | ICD-10-CM

## 2018-01-11 LAB — POCT URINALYSIS DIPSTICK OB
Bilirubin, UA: NEGATIVE
GLUCOSE, UA: NEGATIVE
Ketones, UA: NEGATIVE
LEUKOCYTES UA: NEGATIVE
NITRITE UA: NEGATIVE
PROTEIN: NEGATIVE
RBC UA: NEGATIVE
Spec Grav, UA: 1.01 (ref 1.010–1.025)
Urobilinogen, UA: 0.2 E.U./dL
pH, UA: 7 (ref 5.0–8.0)

## 2018-01-11 NOTE — Progress Notes (Signed)
NEW OB HISTORY AND PHYSICAL  SUBJECTIVE:       Kristen Powers is a 27 y.o. [id number] female, Patient's last menstrual period was 10/13/2017., Estimated Date of Delivery: 07/20/18, [redacted]w[redacted]d, presents today for establishment of Prenatal Care. She has no unusual complaints      Gynecologic History Patient's last menstrual period was 10/13/2017. Normal Contraception: none Last Pap: 2018. Results were: normal  Obstetric History OB History  Gravida Para Term Preterm AB Living  3 1 1  0 1 1  SAB TAB Ectopic Multiple Live Births  1       1    # Outcome Date GA Lbr Len/2nd Weight Sex Delivery Anes PTL Lv  3 Current           2 SAB 2019          1 Term 11/24/13    F    LIV    Past Medical History:  Diagnosis Date  . Breastfeeding (infant) Jul 20, 2014  . Headache   . Thyroid disease     Past Surgical History:  Procedure Laterality Date  . THYROIDECTOMY N/A 08/06/2014   Procedure: THYROIDECTOMY;  Surgeon: Carloyn Manner, MD;  Location: [facility] ORS;  Service: ENT;  Laterality: N/A;  . TYMPANOSTOMY TUBE PLACEMENT      Current Outpatient Medications on File Prior to Visit  Medication Sig Dispense Refill  . levothyroxine (SYNTHROID, LEVOTHROID) 137 MCG tablet Take 137 mcg by mouth daily before breakfast.    . prenatal vitamin w/FE, FA (PRENATAL 1 + 1) 27-1 MG TABS tablet Take 1 tablet by mouth daily at 12 noon.     No current facility-administered medications on file prior to visit.     Allergies  Allergen Reactions  . Latex Swelling and Rash  . Topamax [Topiramate]     Other reaction(s): Unknown    Social History   Socioeconomic History  . Marital status: Married    Spouse name: Not on file  . Number of children: Not on file  . Years of education: Not on file  . Highest education level: Not on file  Occupational History  . Not on file  Social Needs  . Financial resource strain: Not on file  . Food insecurity:    Worry: Not on file    Inability: Not on file  .  Transportation needs:    Medical: Not on file    Non-medical: Not on file  Tobacco Use  . Smoking status: Never Smoker  . Smokeless tobacco: Never Used  Substance and Sexual Activity  . Alcohol use: No  . Drug use: No  . Sexual activity: Yes    Birth control/protection: None  Lifestyle  . Physical activity:    Days per week: Not on file    Minutes per session: Not on file  . Stress: Not on file  Relationships  . Social connections:    Talks on phone: Not on file    Gets together: Not on file    Attends religious service: Not on file    Active member of club or organization: Not on file    Attends meetings of clubs or organizations: Not on file    Relationship status: Not on file  . Intimate partner violence:    Fear of current or ex partner: Not on file    Emotionally abused: Not on file    Physically abused: Not on file    Forced sexual activity: Not on file  Other Topics Concern  .  Not on file  Social History Narrative  . Not on file    Family History  Problem Relation Age of Onset  . Cancer Mother        thyroid  . Diabetes Father   . Breast cancer Neg Hx   . Ovarian cancer Neg Hx   . Colon cancer Neg Hx     The following portions of the patient's history were reviewed and updated as appropriate: allergies, current medications, past OB history, past medical history, past surgical history, past family history, past social history, and problem list.    OBJECTIVE: Initial Physical Exam (New OB)  GENERAL APPEARANCE: alert, well appearing, in no apparent distress, oriented to person, place and time HEAD: normocephalic, atraumatic MOUTH: mucous membranes moist, pharynx normal without lesions THYROID: no thyromegaly or masses present and absent BREASTS: no masses noted, no significant tenderness, no palpable axillary nodes, no skin changes, not examined LUNGS: clear to auscultation, no wheezes, rales or rhonchi, symmetric air entry HEART: regular rate and rhythm,  no murmurs ABDOMEN: soft, nontender, nondistended, no abnormal masses, no epigastric pain, fundus not palpable and FHT present EXTREMITIES: no redness or tenderness in the calves or thighs SKIN: normal coloration and turgor, no rashes LYMPH NODES: no adenopathy palpable NEUROLOGIC: alert, oriented, normal speech, no focal findings or movement disorder noted  PELVIC EXAM not indicated  ASSESSMENT: Normal pregnancy S/p thyroidectomy  PLAN: Prenatal care Thyroid panel each trimester See orders

## 2018-01-11 NOTE — Progress Notes (Signed)
NOB PE- pt is doing well

## 2018-01-11 NOTE — Patient Instructions (Signed)
Second Trimester of Pregnancy The second trimester is from week 13 through week 28, month 4 through 6. This is often the time in pregnancy that you feel your best. Often times, morning sickness has lessened or quit. You may have more energy, and you may get hungry more often. Your unborn baby (fetus) is growing rapidly. At the end of the sixth month, he or she is about 9 inches long and weighs about 1 pounds. You will likely feel the baby move (quickening) between 18 and 20 weeks of pregnancy. Follow these instructions at home:  Avoid all smoking, herbs, and alcohol. Avoid drugs not approved by your doctor.  Do not use any tobacco products, including cigarettes, chewing tobacco, and electronic cigarettes. If you need help quitting, ask your doctor. You may get counseling or other support to help you quit.  Only take medicine as told by your doctor. Some medicines are safe and some are not during pregnancy.  Exercise only as told by your doctor. Stop exercising if you start having cramps.  Eat regular, healthy meals.  Wear a good support bra if your breasts are tender.  Do not use hot tubs, steam rooms, or saunas.  Wear your seat belt when driving.  Avoid raw meat, uncooked cheese, and liter boxes and soil used by cats.  Take your prenatal vitamins.  Take 1500-2000 milligrams of calcium daily starting at the 20th week of pregnancy until you deliver your baby.  Try taking medicine that helps you poop (stool softener) as needed, and if your doctor approves. Eat more fiber by eating fresh fruit, vegetables, and whole grains. Drink enough fluids to keep your pee (urine) clear or pale yellow.  Take warm water baths (sitz baths) to soothe pain or discomfort caused by hemorrhoids. Use hemorrhoid cream if your doctor approves.  If you have puffy, bulging veins (varicose veins), wear support hose. Raise (elevate) your feet for 15 minutes, 3-4 times a day. Limit salt in your diet.  Avoid heavy  lifting, wear low heals, and sit up straight.  Rest with your legs raised if you have leg cramps or low back pain.  Visit your dentist if you have not gone during your pregnancy. Use a soft toothbrush to brush your teeth. Be gentle when you floss.  You can have sex (intercourse) unless your doctor tells you not to.  Go to your doctor visits. Get help if:  You feel dizzy.  You have mild cramps or pressure in your lower belly (abdomen).  You have a nagging pain in your belly area.  You continue to feel sick to your stomach (nauseous), throw up (vomit), or have watery poop (diarrhea).  You have bad smelling fluid coming from your vagina.  You have pain with peeing (urination). Get help right away if:  You have a fever.  You are leaking fluid from your vagina.  You have spotting or bleeding from your vagina.  You have severe belly cramping or pain.  You lose or gain weight rapidly.  You have trouble catching your breath and have chest pain.  You notice sudden or extreme puffiness (swelling) of your face, hands, ankles, feet, or legs.  You have not felt the baby move in over an hour.  You have severe headaches that do not go away with medicine.  You have vision changes. This information is not intended to replace advice given to you by your health care provider. Make sure you discuss any questions you have with your health care   provider. Document Released: 05/06/2009 Document Revised: 07/18/2015 Document Reviewed: 04/12/2012 Elsevier Interactive Patient Education  2017 Elsevier Inc.  

## 2018-01-14 ENCOUNTER — Encounter: Payer: BLUE CROSS/BLUE SHIELD | Admitting: Obstetrics and Gynecology

## 2018-01-26 ENCOUNTER — Other Ambulatory Visit: Payer: Self-pay | Admitting: *Deleted

## 2018-01-26 MED ORDER — TERCONAZOLE 0.4 % VA CREA: 1.0000 | TOPICAL_CREAM | Freq: Every day | VAGINAL | 0 refills | Status: DC

## 2018-02-06 ENCOUNTER — Encounter (HOSPITAL_COMMUNITY): Payer: Self-pay

## 2018-02-08 ENCOUNTER — Encounter: Payer: Self-pay | Admitting: Certified Nurse Midwife

## 2018-02-08 ENCOUNTER — Ambulatory Visit (INDEPENDENT_AMBULATORY_CARE_PROVIDER_SITE_OTHER): Payer: BLUE CROSS/BLUE SHIELD | Admitting: Certified Nurse Midwife

## 2018-02-08 VITALS — BP 127/71 | HR 104 | Wt 184.9 lb

## 2018-02-08 DIAGNOSIS — Z3492 Encounter for supervision of normal pregnancy, unspecified, second trimester: Secondary | ICD-10-CM | POA: Diagnosis not present

## 2018-02-08 LAB — POCT URINALYSIS DIPSTICK OB
BILIRUBIN UA: NEGATIVE
Glucose, UA: NEGATIVE
Leukocytes, UA: NEGATIVE
Nitrite, UA: NEGATIVE
PH UA: 6 (ref 5.0–8.0)
POC,PROTEIN,UA: NEGATIVE
RBC UA: NEGATIVE
Spec Grav, UA: 1.01 (ref 1.010–1.025)
UROBILINOGEN UA: 0.2 U/dL

## 2018-02-08 NOTE — Progress Notes (Signed)
ROB doing well. Occasionally feels flickering. Discussed round ligament pian . Reviewed anatomy u/s at next visit. Difficult finding heart rate with dopplar Heart rate confirmed with u/s.   Philip Aspen, CNM

## 2018-02-08 NOTE — Patient Instructions (Signed)

## 2018-02-08 NOTE — Progress Notes (Signed)
ROB, no complaints.  

## 2018-02-23 NOTE — L&D Delivery Note (Signed)
Delivery Note At  0944 am a viable and healthy female "Kristen Powers" was delivered via  (Presentation:ROA ;  ).  APGAR: 8, 9; weight pending  .   Placenta status:delivered intact with 3 vessel Cord and some calcifications:  with the following complications: x2 easily reduced on perineum .    Anesthesia:  epidural Episiotomy:  none Lacerations:  none Suture Repair: NA Est. Blood Loss (mL):  200  Mom to postpartum.  Baby to Couplet care / Skin to Skin.  Toluwani Ruder N Tyreka Henneke 07/21/2018, 10:02 AM

## 2018-03-01 ENCOUNTER — Ambulatory Visit (INDEPENDENT_AMBULATORY_CARE_PROVIDER_SITE_OTHER): Payer: BLUE CROSS/BLUE SHIELD

## 2018-03-01 ENCOUNTER — Ambulatory Visit (INDEPENDENT_AMBULATORY_CARE_PROVIDER_SITE_OTHER): Payer: BLUE CROSS/BLUE SHIELD | Admitting: Obstetrics and Gynecology

## 2018-03-01 VITALS — BP 128/69 | HR 98 | Wt 185.7 lb

## 2018-03-01 DIAGNOSIS — Z3492 Encounter for supervision of normal pregnancy, unspecified, second trimester: Secondary | ICD-10-CM

## 2018-03-01 NOTE — Progress Notes (Signed)
ROB- anatomy scan done today

## 2018-04-05 ENCOUNTER — Ambulatory Visit (INDEPENDENT_AMBULATORY_CARE_PROVIDER_SITE_OTHER): Payer: BLUE CROSS/BLUE SHIELD

## 2018-04-05 ENCOUNTER — Other Ambulatory Visit: Payer: Self-pay | Admitting: Obstetrics and Gynecology

## 2018-04-05 ENCOUNTER — Ambulatory Visit (INDEPENDENT_AMBULATORY_CARE_PROVIDER_SITE_OTHER): Payer: BLUE CROSS/BLUE SHIELD | Admitting: Obstetrics and Gynecology

## 2018-04-05 VITALS — BP 128/77 | HR 97 | Wt 193.7 lb

## 2018-04-05 DIAGNOSIS — Z3492 Encounter for supervision of normal pregnancy, unspecified, second trimester: Secondary | ICD-10-CM

## 2018-04-05 DIAGNOSIS — Z362 Encounter for other antenatal screening follow-up: Secondary | ICD-10-CM | POA: Diagnosis not present

## 2018-04-05 LAB — POCT URINALYSIS DIPSTICK OB
Bilirubin, UA: NEGATIVE
Blood, UA: NEGATIVE
GLUCOSE, UA: NEGATIVE
Ketones, UA: NEGATIVE
LEUKOCYTES UA: NEGATIVE
NITRITE UA: NEGATIVE
PROTEIN: NEGATIVE
Spec Grav, UA: 1.015 (ref 1.010–1.025)
Urobilinogen, UA: 0.2 E.U./dL
pH, UA: 6 (ref 5.0–8.0)

## 2018-04-05 NOTE — Progress Notes (Signed)
ROB- follow up anatomy scan, pt is doing well

## 2018-04-05 NOTE — Progress Notes (Signed)
ROB and follow anatomy scan below- doing well. Date of Service: 04/05/2018   Indications: Anatomy follow up ultrasound Findings:  Singleton intrauterine pregnancy is visualized with FHR at 144 BPM.  Fetal presentation is Cephalic.  Placenta: posterior. Grade: 1 AFI: subjectively normal.  Anatomic survey is complete for heart,N/Lips and kidneys  There is no free peritoneal fluid in the cul de sac.  Impression: 1. [redacted]w[redacted]d Viable Singleton Intrauterine pregnancy previously established criteria. 2. Normal Anatomy Scan is now complete

## 2018-05-10 ENCOUNTER — Ambulatory Visit (INDEPENDENT_AMBULATORY_CARE_PROVIDER_SITE_OTHER): Payer: BLUE CROSS/BLUE SHIELD | Admitting: Certified Nurse Midwife

## 2018-05-10 ENCOUNTER — Other Ambulatory Visit: Payer: BLUE CROSS/BLUE SHIELD

## 2018-05-10 ENCOUNTER — Other Ambulatory Visit: Payer: Self-pay

## 2018-05-10 VITALS — BP 121/69 | HR 107 | Wt 201.6 lb

## 2018-05-10 DIAGNOSIS — Z3493 Encounter for supervision of normal pregnancy, unspecified, third trimester: Secondary | ICD-10-CM | POA: Diagnosis not present

## 2018-05-10 DIAGNOSIS — Z3492 Encounter for supervision of normal pregnancy, unspecified, second trimester: Secondary | ICD-10-CM | POA: Diagnosis not present

## 2018-05-10 DIAGNOSIS — Z23 Encounter for immunization: Secondary | ICD-10-CM

## 2018-05-10 LAB — POCT URINALYSIS DIPSTICK OB
Bilirubin, UA: NEGATIVE
Glucose, UA: NEGATIVE
KETONES UA: NEGATIVE
LEUKOCYTES UA: NEGATIVE
NITRITE UA: NEGATIVE
PROTEIN: NEGATIVE
RBC UA: NEGATIVE
SPEC GRAV UA: 1.01 (ref 1.010–1.025)
Urobilinogen, UA: 0.2 E.U./dL
pH, UA: 6 (ref 5.0–8.0)

## 2018-05-10 MED ORDER — RHO D IMMUNE GLOBULIN 1500 UNITS IM SOSY
1500.0000 [IU] | PREFILLED_SYRINGE | Freq: Once | INTRAMUSCULAR | Status: AC
  Administered 2018-05-10: 1500 [IU] via INTRAMUSCULAR

## 2018-05-10 MED ORDER — TETANUS-DIPHTH-ACELL PERTUSSIS 5-2.5-18.5 LF-MCG/0.5 IM SUSP
0.5000 mL | Freq: Once | INTRAMUSCULAR | Status: AC
  Administered 2018-05-10: 0.5 mL via INTRAMUSCULAR

## 2018-05-10 NOTE — Patient Instructions (Signed)
WE WOULD LOVE TO HEAR FROM YOU!!!!   Thank you Kristen Powers for visiting [facility].  Providing our patients with the best experience possible is really important to [place], and we hope that you felt that on your recent visit. The most valuable feedback we get comes from [place]!!    If you receive a survey please take a couple of minutes to let us know how we did.Thank you for continuing to trust [place] with your care.   [facility]   WHAT OB PATIENTS CAN EXPECT   Confirmation of pregnancy and ultrasound ordered if medically indicated-[redacted] weeks gestation  New OB (NOB) intake with nurse and New OB (NOB) labs- [redacted] weeks gestation  New OB (NOB) physical examination with provider- 11/[redacted] weeks gestation  Flu vaccine-[redacted] weeks gestation  Anatomy scan-[redacted] weeks gestation  Glucose tolerance test, blood work to test for anemia, T-dap vaccine-[redacted] weeks gestation  Vaginal swabs/cultures-STD/Group B strep-[redacted] weeks gestation  Appointments every 4 weeks until 28 weeks  Every 2 weeks from 28 weeks until 36 weeks  Weekly visits from 36 weeks until delivery    Common Medications Safe in Pregnancy  Acne:      Constipation:  Benzoyl Peroxide     Colace  Clindamycin      Dulcolax Suppository  Topica Erythromycin     Fibercon  Salicylic Acid      Metamucil         Miralax AVOID:        Senakot   Accutane    Cough:  Retin-A       Cough Drops  Tetracycline      Phenergan w/ Codeine if Rx  Minocycline      Robitussin (Plain & DM)  Antibiotics:     Crabs/Lice:  Ceclor       RID  Cephalosporins    AVOID:  E-Mycins      Kwell  Keflex  Macrobid/Macrodantin   Diarrhea:  Penicillin      Kao-Pectate  Zithromax      Imodium AD         PUSH FLUIDS AVOID:       Cipro     Fever:  Tetracycline      Tylenol (Regular or Extra  Minocycline       Strength)  Levaquin      Extra Strength-Do not          Exceed 8 tabs/24 hrs Caffeine:        <257m/day (equiv. To  1 cup of coffee or  approx. 3 12 oz sodas)         Gas: Cold/Hayfever:       Gas-X  Benadryl      Mylicon  Claritin       Phazyme  **Claritin-D        Chlor-Trimeton    Headaches:  Dimetapp      ASA-Free Excedrin  Drixoral-Non-Drowsy     Cold Compress  Mucinex (Guaifenasin)     Tylenol (Regular or Extra  Sudafed/Sudafed-12 Hour     Strength)  **Sudafed PE Pseudoephedrine   Tylenol Cold & Sinus     Vicks Vapor Rub  Zyrtec  **AVOID if Problems With Blood Pressure         Heartburn: Avoid lying down for at least 1 hour after meals  Aciphex      Maalox     Rash:  Milk of Magnesia     Benadryl    Mylanta  1% Hydrocortisone Cream  Pepcid  Pepcid Complete   Sleep Aids:  Prevacid      Ambien   Prilosec       Benadryl  Rolaids       Chamomile Tea  Tums (Limit 4/day)     Unisom  Zantac       Tylenol PM         Warm milk-add vanilla or  Hemorrhoids:       Sugar for taste  Anusol/Anusol H.C.  (RX: Analapram 2.5%)  Sugar Substitutes:  Hydrocortisone OTC     Ok in moderation  Preparation H      Tucks        Vaseline lotion applied to tissue with wiping    Herpes:     Throat:  Acyclovir      Oragel  Famvir  Valtrex     Vaccines:         Flu Shot Leg Cramps:       *Gardasil  Benadryl      Hepatitis A         Hepatitis B Nasal Spray:       Pneumovax  Saline Nasal Spray     Polio Booster         Tetanus Nausea:       Tuberculosis test or PPD  Vitamin B6 25 mg TID   AVOID:    Dramamine      *Gardasil  Emetrol       Live Poliovirus  Ginger Root 250 mg QID    MMR (measles, mumps &  High Complex Carbs @ Bedtime    rebella)  Sea Bands-Accupressure    Varicella (Chickenpox)  Unisom 1/2 tab TID     *No known complications           If received before Pain:         Known pregnancy;   Darvocet       Resume series after  Lortab        Delivery  Percocet    Yeast:   Tramadol      Femstat  Tylenol 3      Gyne-lotrimin  Ultram       Monistat  Vicodin           MISC:          All Sunscreens           Hair Coloring/highlights          Insect Repellant's          (Including DEET)         Mystic Tans Third Trimester of Pregnancy  The third trimester is from week 28 through week 40 (months 7 through 9). This trimester is when your unborn baby (fetus) is growing very fast. At the end of the ninth month, the unborn baby is about 20 inches in length. It weighs about 6-10 pounds. Follow these instructions at home: Medicines  Take over-the-counter and prescription medicines only as told by your doctor. Some medicines are safe and some medicines are not safe during pregnancy.  Take a prenatal vitamin that contains at least 600 micrograms (mcg) of folic acid.  If you have trouble pooping (constipation), take medicine that will make your stool soft (stool softener) if your doctor approves. Eating and drinking   Eat regular, healthy meals.  Avoid raw meat and uncooked cheese.  If you get low calcium from the food you eat, talk to your doctor about taking a daily  calcium supplement.  Eat four or five small meals rather than three large meals a day.  Avoid foods that are high in fat and sugars, such as fried and sweet foods.  To prevent constipation: ? Eat foods that are high in fiber, like fresh fruits and vegetables, whole grains, and beans. ? Drink enough fluids to keep your pee (urine) clear or pale yellow. Activity  Exercise only as told by your doctor. Stop exercising if you start to have cramps.  Avoid heavy lifting, wear low heels, and sit up straight.  Do not exercise if it is too hot, too humid, or if you are in a place of great height (high altitude).  You may continue to have sex unless your doctor tells you not to. Relieving pain and discomfort  Wear a good support bra if your breasts are tender.  Take frequent breaks and rest with your legs raised if you have leg cramps or low back pain.  Take warm water baths (sitz baths) to soothe  pain or discomfort caused by hemorrhoids. Use hemorrhoid cream if your doctor approves.  If you develop puffy, bulging veins (varicose veins) in your legs: ? Wear support hose or compression stockings as told by your doctor. ? Raise (elevate) your feet for 15 minutes, 3-4 times a day. ? Limit salt in your food. Safety  Wear your seat belt when driving.  Make a list of emergency phone numbers, including numbers for family, friends, the hospital, and police and fire departments. Preparing for your baby's arrival To prepare for the arrival of your baby:  Take prenatal classes.  Practice driving to the hospital.  Visit the hospital and tour the maternity area.  Talk to your work about taking leave once the baby comes.  Pack your hospital bag.  Prepare the baby's room.  Go to your doctor visits.  Buy a rear-facing car seat. Learn how to install it in your car. General instructions  Do not use hot tubs, steam rooms, or saunas.  Do not use any products that contain nicotine or tobacco, such as cigarettes and e-cigarettes. If you need help quitting, ask your doctor.  Do not drink alcohol.  Do not douche or use tampons or scented sanitary pads.  Do not cross your legs for long periods of time.  Do not travel for long distances unless you must. Only do so if your doctor says it is okay.  Visit your dentist if you have not gone during your pregnancy. Use a soft toothbrush to brush your teeth. Be gentle when you floss.  Avoid cat litter boxes and soil used by cats. These carry germs that can cause birth defects in the baby and can cause a loss of your baby (miscarriage) or stillbirth.  Keep all your prenatal visits as told by your doctor. This is important. Contact a doctor if:  You are not sure if you are in labor or if your water has broken.  You are dizzy.  You have mild cramps or pressure in your lower belly.  You have a nagging pain in your belly area.  You continue  to feel sick to your stomach, you throw up, or you have watery poop.  You have bad smelling fluid coming from your vagina.  You have pain when you pee. Get help right away if:  You have a fever.  You are leaking fluid from your vagina.  You are spotting or bleeding from your vagina.  You have severe belly cramps or  pain.  You lose or gain weight quickly.  You have trouble catching your breath and have chest pain.  You notice sudden or extreme puffiness (swelling) of your face, hands, ankles, feet, or legs.  You have not felt the baby move in over an hour.  You have severe headaches that do not go away with medicine.  You have trouble seeing.  You are leaking, or you are having a gush of fluid, from your vagina before you are 37 weeks.  You have regular belly spasms (contractions) before you are 37 weeks. Summary  The third trimester is from week 28 through week 40 (months 7 through 9). This time is when your unborn baby is growing very fast.  Follow your doctor's advice about medicine, food, and activity.  Get ready for the arrival of your baby by taking prenatal classes, getting all the baby items ready, preparing the baby's room, and visiting your doctor to be checked.  Get help right away if you are bleeding from your vagina, or you have chest pain and trouble catching your breath, or if you have not felt your baby move in over an hour. This information is not intended to replace advice given to you by your health care provider. Make sure you discuss any questions you have with your health care provider. Document Released: 05/06/2009 Document Revised: 03/17/2016 Document Reviewed: 03/17/2016 Elsevier Interactive Patient Education  2019 [employer].

## 2018-05-10 NOTE — Progress Notes (Signed)
ROB- glucola done, blood consent, rhogam given, tdap given, pt is doing well

## 2018-05-11 LAB — CBC
HEMOGLOBIN: 11 g/dL — AB (ref 11.1–15.9)
Hematocrit: 32.6 % — ABNORMAL LOW (ref 34.0–46.6)
MCH: 32.9 pg (ref 26.6–33.0)
MCHC: 33.7 g/dL (ref 31.5–35.7)
MCV: 98 fL — ABNORMAL HIGH (ref 79–97)
PLATELETS: 222 10*3/uL (ref 150–450)
RBC: 3.34 x10E6/uL — ABNORMAL LOW (ref 3.77–5.28)
RDW: 11.9 % (ref 11.7–15.4)
WBC: 8.4 10*3/uL (ref 3.4–10.8)

## 2018-05-11 LAB — RPR: RPR: NONREACTIVE

## 2018-05-11 LAB — GLUCOSE, 1 HOUR GESTATIONAL: GESTATIONAL DIABETES SCREEN: 118 mg/dL (ref 65–139)

## 2018-05-12 NOTE — Progress Notes (Signed)
ROB-Doing well. 28 week labs today. Rhogam and TDaP given. Blood transfusion consent reviewed and signed. Plans epidural and breastfeeding. Circumcision with [facility]. Anticipatory guidance regarding course of prenatal care. Reviewed red flag symptoms and when to call. RTC x 2 weeks for ROB or sooner if needed.

## 2018-05-18 ENCOUNTER — Telehealth: Payer: Self-pay | Admitting: Obstetrics and Gynecology

## 2018-05-18 ENCOUNTER — Other Ambulatory Visit: Payer: Self-pay | Admitting: Obstetrics and Gynecology

## 2018-05-18 MED ORDER — HYDROCOD POLST-CPM POLST ER 10-8 MG/5ML PO SUER: 5.0000 mL | Freq: Two times a day (BID) | ORAL | 0 refills | Status: DC | PRN

## 2018-05-18 NOTE — Telephone Encounter (Signed)
Per telephone call, patient reports symptoms started 4 days ago after being outside exposed to pollen, feels extremely fatigued and wanting to sleep all the time. Denies fever, nasal drainage clear. Dry cough, worse when laying down. Pain in right lower ribs when coughing. Has some intermittent wheezing. Started allegra 2 days ago and feeling slightly better today. Has not traveled or been around anyone that has in last 2 weeks.   Instructed to continue allegra for the next 2 weeks, will send in prescription for tussionex syrup for prn use. To notify us if fever develops or symptoms worsen. Will reassess at upcoming appointment in 5 days.  Melody Shambley,CNM

## 2018-05-18 NOTE — Telephone Encounter (Signed)
Patient called stating she thinks she may have pneumonia. She has been extremely tired and has a cough. She is also complaining of in her right rib area and has no appetite. She states the baby is active. Thanks

## 2018-05-23 NOTE — Progress Notes (Signed)
Coronavirus (COVID-19) Are you at risk?  Are you at risk for the Coronavirus (COVID-19)?  To be considered HIGH RISK for Coronavirus (COVID-19), you have to meet the following criteria:  . Traveled to [place], [place], [place], [place] or [place]; or in the [place] to [place], [place], [place], or [place]; and have fever, cough, and shortness of breath within the last 2 weeks of travel OR . Been in close contact with a person diagnosed with COVID-19 within the last 2 weeks and have fever, cough, and shortness of breath . IF YOU DO NOT MEET THESE CRITERIA, YOU ARE CONSIDERED LOW RISK FOR COVID-19.  What to do if you are HIGH RISK for COVID-19?  Marland Kitchen If you are having a medical emergency, call 911. . Seek medical care right away. Before you go to a doctor's office, urgent care or emergency department, call ahead and tell them about your recent travel, contact with someone diagnosed with COVID-19, and your symptoms. You should receive instructions from your physician's office regarding next steps of care.  . When you arrive at healthcare provider, tell the healthcare staff immediately you have returned from visiting [place], [place], [place], [place] or [place]; or traveled in the [place] to [place], [place], [place], or [place]; in the last two weeks or you have been in close contact with a person diagnosed with COVID-19 in the last 2 weeks.   . Tell the health care staff about your symptoms: fever, cough and shortness of breath. . After you have been seen by a medical provider, you will be either: o Tested for (COVID-19) and discharged home on quarantine except to seek medical care if symptoms worsen, and asked to  - Stay home and avoid contact with others until you get your results (4-5 days)  - Avoid travel on public transportation if possible (such as bus, train, or airplane) or o Sent to the Emergency Department by EMS for evaluation, COVID-19 testing, and possible  admission depending on your condition and test results.  What to do if you are LOW RISK for COVID-19?  Reduce your risk of any infection by using the same precautions used for avoiding the common cold or flu:  Marland Kitchen Wash your hands often with soap and warm water for at least 20 seconds.  If soap and water are not readily available, use an alcohol-based hand sanitizer with at least 60% alcohol.  . If coughing or sneezing, cover your mouth and nose by coughing or sneezing into the elbow areas of your shirt or coat, into a tissue or into your sleeve (not your hands). . Avoid shaking hands with others and consider head nods or verbal greetings only. . Avoid touching your eyes, nose, or mouth with unwashed hands.  . Avoid close contact with people who are sick. . Avoid places or events with large numbers of people in one location, like concerts or sporting events. . Carefully consider travel plans you have or are making. . If you are planning any travel outside or inside the [place], visit the CDC's Travelers' Health webpage for the latest health notices. . If you have some symptoms but not all symptoms, continue to monitor at home and seek medical attention if your symptoms worsen. . If you are having a medical emergency, call 911.  Spoke with pt denies any sx.  Keturah Barre, CMA   ADDITIONAL HEALTHCARE OPTIONS FOR PATIENTS  [facility] / e-Visit: [email or web address]  [facility]: [facility]: [phone]                   [facility]: [phone]

## 2018-05-24 ENCOUNTER — Other Ambulatory Visit: Payer: Self-pay

## 2018-05-24 ENCOUNTER — Ambulatory Visit (INDEPENDENT_AMBULATORY_CARE_PROVIDER_SITE_OTHER): Payer: BLUE CROSS/BLUE SHIELD | Admitting: Certified Nurse Midwife

## 2018-05-24 ENCOUNTER — Encounter: Payer: Self-pay | Admitting: Certified Nurse Midwife

## 2018-05-24 VITALS — BP 110/73 | HR 91 | Wt 197.1 lb

## 2018-05-24 DIAGNOSIS — Z3493 Encounter for supervision of normal pregnancy, unspecified, third trimester: Secondary | ICD-10-CM

## 2018-05-24 LAB — POCT URINALYSIS DIPSTICK OB
BILIRUBIN UA: NEGATIVE
Blood, UA: NEGATIVE
GLUCOSE, UA: NEGATIVE
Ketones, UA: NEGATIVE
LEUKOCYTES UA: NEGATIVE
Nitrite, UA: NEGATIVE
POC,PROTEIN,UA: NEGATIVE
SPEC GRAV UA: 1.01 (ref 1.010–1.025)
Urobilinogen, UA: 0.2 E.U./dL
pH, UA: 8 (ref 5.0–8.0)

## 2018-05-24 NOTE — Progress Notes (Signed)
ROB, doing well. Feels good movement. Discussed return in 5 wks @ 36 wks. She verbalizes and agrees to plan.   Philip Aspen, CNM

## 2018-06-07 ENCOUNTER — Encounter: Payer: BLUE CROSS/BLUE SHIELD | Admitting: Obstetrics and Gynecology

## 2018-06-20 ENCOUNTER — Telehealth: Payer: Self-pay

## 2018-06-20 NOTE — Telephone Encounter (Signed)
Coronavirus (COVID-19) Are you at risk?  Are you at risk for the Coronavirus (COVID-19)?  To be considered HIGH RISK for Coronavirus (COVID-19), you have to meet the following criteria:  . Traveled to [place], [place], [place], [place] or [place]; or in the [place] to [place], [place], [place], or [place]; and have fever, cough, and shortness of breath within the last 2 weeks of travel OR . Been in close contact with a person diagnosed with COVID-19 within the last 2 weeks and have fever, cough, and shortness of breath . IF YOU DO NOT MEET THESE CRITERIA, YOU ARE CONSIDERED LOW RISK FOR COVID-19.  What to do if you are HIGH RISK for COVID-19?  Marland Kitchen If you are having a medical emergency, call 911. . Seek medical care right away. Before you go to a doctor's office, urgent care or emergency department, call ahead and tell them about your recent travel, contact with someone diagnosed with COVID-19, and your symptoms. You should receive instructions from your physician's office regarding next steps of care.  . When you arrive at healthcare provider, tell the healthcare staff immediately you have returned from visiting [place], [place], [place], [place] or [place]; or traveled in the [place] to [place], [place], [place], or [place]; in the last two weeks or you have been in close contact with a person diagnosed with COVID-19 in the last 2 weeks.   . Tell the health care staff about your symptoms: fever, cough and shortness of breath. . After you have been seen by a medical provider, you will be either: o Tested for (COVID-19) and discharged home on quarantine except to seek medical care if symptoms worsen, and asked to  - Stay home and avoid contact with others until you get your results (4-5 days)  - Avoid travel on public transportation if possible (such as bus, train, or airplane) or o Sent to the Emergency Department by EMS for evaluation, COVID-19 testing, and possible  admission depending on your condition and test results.  What to do if you are LOW RISK for COVID-19?  Reduce your risk of any infection by using the same precautions used for avoiding the common cold or flu:  Marland Kitchen Wash your hands often with soap and warm water for at least 20 seconds.  If soap and water are not readily available, use an alcohol-based hand sanitizer with at least 60% alcohol.  . If coughing or sneezing, cover your mouth and nose by coughing or sneezing into the elbow areas of your shirt or coat, into a tissue or into your sleeve (not your hands). . Avoid shaking hands with others and consider head nods or verbal greetings only. . Avoid touching your eyes, nose, or mouth with unwashed hands.  . Avoid close contact with people who are Kristen Powers. . Avoid places or events with large numbers of people in one location, like concerts or sporting events. . Carefully consider travel plans you have or are making. . If you are planning any travel outside or inside the [place], visit the CDC's Travelers' Health webpage for the latest health notices. . If you have some symptoms but not all symptoms, continue to monitor at home and seek medical attention if your symptoms worsen. . If you are having a medical emergency, call 911.  06/20/18 SCREENING NEG SLS ADDITIONAL HEALTHCARE OPTIONS FOR PATIENTS  [facility] / e-Visit: [email or web address]         [facility]: [phone]  [facility]: [phone]                   [facility]: [phone]  

## 2018-06-21 ENCOUNTER — Other Ambulatory Visit: Payer: Self-pay

## 2018-06-21 ENCOUNTER — Ambulatory Visit (INDEPENDENT_AMBULATORY_CARE_PROVIDER_SITE_OTHER): Payer: BLUE CROSS/BLUE SHIELD | Admitting: Obstetrics and Gynecology

## 2018-06-21 VITALS — BP 114/75 | HR 100 | Wt 200.2 lb

## 2018-06-21 DIAGNOSIS — Z113 Encounter for screening for infections with a predominantly sexual mode of transmission: Secondary | ICD-10-CM | POA: Diagnosis not present

## 2018-06-21 DIAGNOSIS — Z3493 Encounter for supervision of normal pregnancy, unspecified, third trimester: Secondary | ICD-10-CM

## 2018-06-21 DIAGNOSIS — Z3685 Encounter for antenatal screening for Streptococcus B: Secondary | ICD-10-CM | POA: Diagnosis not present

## 2018-06-21 LAB — POCT URINALYSIS DIPSTICK OB
Bilirubin, UA: NEGATIVE
Blood, UA: NEGATIVE
Glucose, UA: NEGATIVE
Ketones, UA: NEGATIVE
Leukocytes, UA: NEGATIVE
Nitrite, UA: NEGATIVE
POC,PROTEIN,UA: NEGATIVE
Spec Grav, UA: 1.015 (ref 1.010–1.025)
Urobilinogen, UA: 0.2 E.U./dL
pH, UA: 7.5 (ref 5.0–8.0)

## 2018-06-21 NOTE — Progress Notes (Signed)
ROB and cultures- labor precautions discussed.

## 2018-06-21 NOTE — Progress Notes (Signed)
ROB- cultures obtained, pt is having some pelvic pressure

## 2018-06-22 LAB — CBC WITH DIFFERENTIAL/PLATELET
Basophils Absolute: 0.1 10*3/uL (ref 0.0–0.2)
Basos: 1 %
EOS (ABSOLUTE): 0.2 10*3/uL (ref 0.0–0.4)
Eos: 2 %
Hematocrit: 30.9 % — ABNORMAL LOW (ref 34.0–46.6)
Hemoglobin: 10.6 g/dL — ABNORMAL LOW (ref 11.1–15.9)
Immature Grans (Abs): 0.2 10*3/uL — ABNORMAL HIGH (ref 0.0–0.1)
Immature Granulocytes: 2 %
Lymphocytes Absolute: 2 10*3/uL (ref 0.7–3.1)
Lymphs: 19 %
MCH: 31.2 pg (ref 26.6–33.0)
MCHC: 34.3 g/dL (ref 31.5–35.7)
MCV: 91 fL (ref 79–97)
Monocytes Absolute: 0.9 10*3/uL (ref 0.1–0.9)
Monocytes: 8 %
Neutrophils Absolute: 7.3 10*3/uL — ABNORMAL HIGH (ref 1.4–7.0)
Neutrophils: 68 %
Platelets: 211 10*3/uL (ref 150–450)
RBC: 3.4 x10E6/uL — ABNORMAL LOW (ref 3.77–5.28)
RDW: 12.1 % (ref 11.7–15.4)
WBC: 10.7 10*3/uL (ref 3.4–10.8)

## 2018-06-22 LAB — TSH: TSH: 11.55 u[IU]/mL — ABNORMAL HIGH (ref 0.450–4.500)

## 2018-06-23 ENCOUNTER — Other Ambulatory Visit: Payer: Self-pay | Admitting: Obstetrics and Gynecology

## 2018-06-23 LAB — STREP GP B NAA: Strep Gp B NAA: POSITIVE — AB

## 2018-06-23 MED ORDER — LEVOTHYROXINE SODIUM 175 MCG PO TABS: 175.0000 ug | ORAL_TABLET | Freq: Every day | ORAL | 2 refills | Status: DC

## 2018-06-24 LAB — GC/CHLAMYDIA PROBE AMP
Chlamydia trachomatis, NAA: NEGATIVE
Neisseria Gonorrhoeae by PCR: NEGATIVE

## 2018-07-06 ENCOUNTER — Other Ambulatory Visit: Payer: Self-pay

## 2018-07-06 ENCOUNTER — Ambulatory Visit (INDEPENDENT_AMBULATORY_CARE_PROVIDER_SITE_OTHER): Payer: BLUE CROSS/BLUE SHIELD | Admitting: Obstetrics and Gynecology

## 2018-07-06 VITALS — BP 118/80 | HR 97 | Wt 202.3 lb

## 2018-07-06 DIAGNOSIS — Z3493 Encounter for supervision of normal pregnancy, unspecified, third trimester: Secondary | ICD-10-CM

## 2018-07-06 LAB — POCT URINALYSIS DIPSTICK OB
Bilirubin, UA: NEGATIVE
Blood, UA: NEGATIVE
Glucose, UA: NEGATIVE
Ketones, UA: NEGATIVE
Leukocytes, UA: NEGATIVE
Nitrite, UA: NEGATIVE
POC,PROTEIN,UA: NEGATIVE
Spec Grav, UA: 1.01 (ref 1.010–1.025)
Urobilinogen, UA: 0.2 E.U./dL
pH, UA: 6.5 (ref 5.0–8.0)

## 2018-07-06 NOTE — Progress Notes (Signed)
ROB- discussed GBS prophylaxis in labor, has started new dose of thyroid med, will recheck in 2 weeks.

## 2018-07-06 NOTE — Progress Notes (Signed)
ROB- pt is having some pelvic pressure

## 2018-07-13 ENCOUNTER — Telehealth: Payer: Self-pay

## 2018-07-13 NOTE — Telephone Encounter (Signed)
Coronavirus (COVID-19) Are you at risk?  Are you at risk for the Coronavirus (COVID-19)?  To be considered HIGH RISK for Coronavirus (COVID-19), you have to meet the following criteria:  . Traveled to [place], [place], [place], [place] or [place]; or in the [place] to [place], [place], [place], or [place]; and have fever, cough, and shortness of breath within the last 2 weeks of travel OR . Been in close contact with a person diagnosed with COVID-19 within the last 2 weeks and have fever, cough, and shortness of breath . IF YOU DO NOT MEET THESE CRITERIA, YOU ARE CONSIDERED LOW RISK FOR COVID-19.  What to do if you are HIGH RISK for COVID-19?  . If you are having a medical emergency, call 911. . Seek medical care right away. Before you go to a doctor's office, urgent care or emergency department, call ahead and tell them about your recent travel, contact with someone diagnosed with COVID-19, and your symptoms. You should receive instructions from your physician's office regarding next steps of care.  . When you arrive at healthcare provider, tell the healthcare staff immediately you have returned from visiting [place], [place], [place], [place] or [place]; or traveled in the [place] to [place], [place], [place], or [place]; in the last two weeks or you have been in close contact with a person diagnosed with COVID-19 in the last 2 weeks.   . Tell the health care staff about your symptoms: fever, cough and shortness of breath. . After you have been seen by a medical provider, you will be either: o Tested for (COVID-19) and discharged home on quarantine except to seek medical care if symptoms worsen, and asked to  - Stay home and avoid contact with others until you get your results (4-5 days)  - Avoid travel on public transportation if possible (such as bus, train, or airplane) or o Sent to the Emergency Department by EMS for evaluation, COVID-19 testing, and possible  admission depending on your condition and test results.  What to do if you are LOW RISK for COVID-19?  Reduce your risk of any infection by using the same precautions used for avoiding the common cold or flu:  . Wash your hands often with soap and warm water for at least 20 seconds.  If soap and water are not readily available, use an alcohol-based hand sanitizer with at least 60% alcohol.  . If coughing or sneezing, cover your mouth and nose by coughing or sneezing into the elbow areas of your shirt or coat, into a tissue or into your sleeve (not your hands). . Avoid shaking hands with others and consider head nods or verbal greetings only. . Avoid touching your eyes, nose, or mouth with unwashed hands.  . Avoid close contact with people who are sick. . Avoid places or events with large numbers of people in one location, like concerts or sporting events. . Carefully consider travel plans you have or are making. . If you are planning any travel outside or inside the [place], visit the CDC's Travelers' Health webpage for the latest health notices. . If you have some symptoms but not all symptoms, continue to monitor at home and seek medical attention if your symptoms worsen. . If you are having a medical emergency, call 911.   ADDITIONAL HEALTHCARE OPTIONS FOR PATIENTS  [facility] / e-Visit: [email or web address]         [facility]: [phone]  [facility]: [phone]                   [facility]: [phone]   Pre-screen negative, DM.   

## 2018-07-14 ENCOUNTER — Other Ambulatory Visit: Payer: Self-pay

## 2018-07-14 ENCOUNTER — Ambulatory Visit (INDEPENDENT_AMBULATORY_CARE_PROVIDER_SITE_OTHER): Payer: BLUE CROSS/BLUE SHIELD | Admitting: Obstetrics and Gynecology

## 2018-07-14 VITALS — BP 107/71 | HR 92 | Wt 204.3 lb

## 2018-07-14 DIAGNOSIS — Z3493 Encounter for supervision of normal pregnancy, unspecified, third trimester: Secondary | ICD-10-CM

## 2018-07-14 LAB — POCT URINALYSIS DIPSTICK OB
Bilirubin, UA: NEGATIVE
Blood, UA: NEGATIVE
Glucose, UA: NEGATIVE
Ketones, UA: NEGATIVE
Nitrite, UA: NEGATIVE
POC,PROTEIN,UA: NEGATIVE
Spec Grav, UA: 1.015 (ref 1.010–1.025)
Urobilinogen, UA: 0.2 E.U./dL
pH, UA: 6.5 (ref 5.0–8.0)

## 2018-07-14 NOTE — Progress Notes (Signed)
ROB- labor precautions discussed

## 2018-07-14 NOTE — Progress Notes (Signed)
ROB- pt is having a lot of pelvic pressure

## 2018-07-19 ENCOUNTER — Telehealth: Payer: Self-pay

## 2018-07-19 NOTE — Telephone Encounter (Signed)
Coronavirus (COVID-19) Are you at risk?  Are you at risk for the Coronavirus (COVID-19)?  To be considered HIGH RISK for Coronavirus (COVID-19), you have to meet the following criteria:  . Traveled to [place], [place], [place], [place] or [place]; or in the [place] to [place], [place], [place], or [place]; and have fever, cough, and shortness of breath within the last 2 weeks of travel OR . Been in close contact with a person diagnosed with COVID-19 within the last 2 weeks and have fever, cough, and shortness of breath . IF YOU DO NOT MEET THESE CRITERIA, YOU ARE CONSIDERED LOW RISK FOR COVID-19.  What to do if you are HIGH RISK for COVID-19?  . If you are having a medical emergency, call 911. . Seek medical care right away. Before you go to a doctor's office, urgent care or emergency department, call ahead and tell them about your recent travel, contact with someone diagnosed with COVID-19, and your symptoms. You should receive instructions from your physician's office regarding next steps of care.  . When you arrive at healthcare provider, tell the healthcare staff immediately you have returned from visiting [place], [place], [place], [place] or [place]; or traveled in the [place] to [place], [place], [place], or [place]; in the last two weeks or you have been in close contact with a person diagnosed with COVID-19 in the last 2 weeks.   . Tell the health care staff about your symptoms: fever, cough and shortness of breath. . After you have been seen by a medical provider, you will be either: o Tested for (COVID-19) and discharged home on quarantine except to seek medical care if symptoms worsen, and asked to  - Stay home and avoid contact with others until you get your results (4-5 days)  - Avoid travel on public transportation if possible (such as bus, train, or airplane) or o Sent to the Emergency Department by EMS for evaluation, COVID-19 testing, and possible  admission depending on your condition and test results.  What to do if you are LOW RISK for COVID-19?  Reduce your risk of any infection by using the same precautions used for avoiding the common cold or flu:  . Wash your hands often with soap and warm water for at least 20 seconds.  If soap and water are not readily available, use an alcohol-based hand sanitizer with at least 60% alcohol.  . If coughing or sneezing, cover your mouth and nose by coughing or sneezing into the elbow areas of your shirt or coat, into a tissue or into your sleeve (not your hands). . Avoid shaking hands with others and consider head nods or verbal greetings only. . Avoid touching your eyes, nose, or mouth with unwashed hands.  . Avoid close contact with people who are sick. . Avoid places or events with large numbers of people in one location, like concerts or sporting events. . Carefully consider travel plans you have or are making. . If you are planning any travel outside or inside the [place], visit the CDC's Travelers' Health webpage for the latest health notices. . If you have some symptoms but not all symptoms, continue to monitor at home and seek medical attention if your symptoms worsen. . If you are having a medical emergency, call 911.   ADDITIONAL HEALTHCARE OPTIONS FOR PATIENTS  [facility] / e-Visit: [email or web address]         [facility]: [phone]  [facility]: [phone]                   [facility]: [phone]   Pre-screen negative, DM.   

## 2018-07-20 ENCOUNTER — Ambulatory Visit (INDEPENDENT_AMBULATORY_CARE_PROVIDER_SITE_OTHER): Payer: BLUE CROSS/BLUE SHIELD

## 2018-07-20 ENCOUNTER — Other Ambulatory Visit: Payer: Self-pay

## 2018-07-20 ENCOUNTER — Inpatient Hospital Stay
Admission: RE | Admit: 2018-07-20 | Discharge: 2018-07-22 | DRG: 807 | Disposition: A | Discharge status: Home / Self Care | Payer: BLUE CROSS/BLUE SHIELD | Attending: Obstetrics and Gynecology | Admitting: Obstetrics and Gynecology

## 2018-07-20 ENCOUNTER — Other Ambulatory Visit: Payer: Self-pay | Admitting: Obstetrics and Gynecology

## 2018-07-20 ENCOUNTER — Ambulatory Visit (INDEPENDENT_AMBULATORY_CARE_PROVIDER_SITE_OTHER): Payer: BLUE CROSS/BLUE SHIELD | Admitting: Obstetrics and Gynecology

## 2018-07-20 VITALS — BP 108/84 | HR 99 | Wt 205.0 lb

## 2018-07-20 DIAGNOSIS — O480 Post-term pregnancy: Secondary | ICD-10-CM

## 2018-07-20 DIAGNOSIS — O9081 Anemia of the puerperium: Secondary | ICD-10-CM | POA: Diagnosis not present

## 2018-07-20 DIAGNOSIS — O26893 Other specified pregnancy related conditions, third trimester: Secondary | ICD-10-CM

## 2018-07-20 DIAGNOSIS — Z3A40 40 weeks gestation of pregnancy: Secondary | ICD-10-CM | POA: Diagnosis not present

## 2018-07-20 DIAGNOSIS — O99284 Endocrine, nutritional and metabolic diseases complicating childbirth: Secondary | ICD-10-CM | POA: Diagnosis not present

## 2018-07-20 DIAGNOSIS — E039 Hypothyroidism, unspecified: Secondary | ICD-10-CM | POA: Diagnosis not present

## 2018-07-20 DIAGNOSIS — Z3493 Encounter for supervision of normal pregnancy, unspecified, third trimester: Secondary | ICD-10-CM

## 2018-07-20 DIAGNOSIS — R102 Pelvic and perineal pain: Secondary | ICD-10-CM

## 2018-07-20 DIAGNOSIS — O99824 Streptococcus B carrier state complicating childbirth: Secondary | ICD-10-CM | POA: Diagnosis not present

## 2018-07-20 DIAGNOSIS — Z1159 Encounter for screening for other viral diseases: Secondary | ICD-10-CM | POA: Diagnosis not present

## 2018-07-20 DIAGNOSIS — Z3490 Encounter for supervision of normal pregnancy, unspecified, unspecified trimester: Secondary | ICD-10-CM | POA: Diagnosis present

## 2018-07-20 LAB — POCT URINALYSIS DIPSTICK OB
Bilirubin, UA: NEGATIVE
Blood, UA: NEGATIVE
Glucose, UA: NEGATIVE
Ketones, UA: NEGATIVE
Nitrite, UA: NEGATIVE
POC,PROTEIN,UA: NEGATIVE
Spec Grav, UA: 1.01 (ref 1.010–1.025)
Urobilinogen, UA: 0.2 E.U./dL
pH, UA: 6.5 (ref 5.0–8.0)

## 2018-07-20 LAB — SARS CORONAVIRUS 2 BY RT PCR (HOSPITAL ORDER, PERFORMED IN ~~LOC~~ HOSPITAL LAB): SARS Coronavirus 2: NEGATIVE

## 2018-07-20 LAB — CBC
HCT: 32.1 % — ABNORMAL LOW (ref 36.0–46.0)
Hemoglobin: 10.8 g/dL — ABNORMAL LOW (ref 12.0–15.0)
MCH: 30 pg (ref 26.0–34.0)
MCHC: 33.6 g/dL (ref 30.0–36.0)
MCV: 89.2 fL (ref 80.0–100.0)
Platelets: 232 10*3/uL (ref 150–400)
RBC: 3.6 MIL/uL — ABNORMAL LOW (ref 3.87–5.11)
RDW: 13.1 % (ref 11.5–15.5)
WBC: 10.4 10*3/uL (ref 4.0–10.5)
nRBC: 0 % (ref 0.0–0.2)

## 2018-07-20 LAB — ABO/RH: ABO/RH(D): O NEG

## 2018-07-20 LAB — TSH: TSH: 4.057 u[IU]/mL (ref 0.350–4.500)

## 2018-07-20 MED ORDER — LACTATED RINGERS IV SOLN
500.0000 mL | INTRAVENOUS | Status: DC | PRN
  Administered 2018-07-21: 500 mL via INTRAVENOUS

## 2018-07-20 MED ORDER — SOD CITRATE-CITRIC ACID 500-334 MG/5ML PO SOLN: 30.0000 mL | ORAL | Status: DC | PRN

## 2018-07-20 MED ORDER — SODIUM CHLORIDE 0.9% FLUSH: 3.0000 mL | INTRAVENOUS | Status: DC | PRN

## 2018-07-20 MED ORDER — FENTANYL CITRATE (PF) 100 MCG/2ML IJ SOLN
50.0000 ug | INTRAMUSCULAR | Status: DC | PRN
  Administered 2018-07-21 (×2): 100 ug via INTRAVENOUS
  Filled 2018-07-20 (×2): qty 2

## 2018-07-20 MED ORDER — LIDOCAINE HCL (PF) 1 % IJ SOLN: 30.0000 mL | INTRAMUSCULAR | Status: DC | PRN

## 2018-07-20 MED ORDER — OXYTOCIN 40 UNITS IN NORMAL SALINE INFUSION - SIMPLE MED: 2.5000 [IU]/h | INTRAVENOUS | Status: DC

## 2018-07-20 MED ORDER — ACETAMINOPHEN 325 MG PO TABS: 650.0000 mg | ORAL_TABLET | ORAL | Status: DC | PRN

## 2018-07-20 MED ORDER — AMMONIA AROMATIC IN INHA
RESPIRATORY_TRACT | Status: AC
  Filled 2018-07-20: qty 10

## 2018-07-20 MED ORDER — TERBUTALINE SULFATE 1 MG/ML IJ SOLN: 0.2500 mg | Freq: Once | INTRAMUSCULAR | Status: DC | PRN

## 2018-07-20 MED ORDER — OXYCODONE-ACETAMINOPHEN 5-325 MG PO TABS: 2.0000 | ORAL_TABLET | ORAL | Status: DC | PRN

## 2018-07-20 MED ORDER — LIDOCAINE HCL (PF) 1 % IJ SOLN
INTRAMUSCULAR | Status: AC
  Filled 2018-07-20: qty 30

## 2018-07-20 MED ORDER — SODIUM CHLORIDE 0.9 % IV SOLN
1.0000 g | INTRAVENOUS | Status: DC
  Administered 2018-07-20 – 2018-07-21 (×3): 1 g via INTRAVENOUS
  Filled 2018-07-20 (×3): qty 1000

## 2018-07-20 MED ORDER — MISOPROSTOL 200 MCG PO TABS
ORAL_TABLET | ORAL | Status: AC
  Administered 2018-07-21: 50 ug via VAGINAL
  Filled 2018-07-20: qty 4

## 2018-07-20 MED ORDER — OXYTOCIN BOLUS FROM INFUSION
500.0000 mL | Freq: Once | INTRAVENOUS | Status: AC
  Administered 2018-07-21: 500 mL via INTRAVENOUS

## 2018-07-20 MED ORDER — SODIUM CHLORIDE 0.9 % IV SOLN
2.0000 g | Freq: Once | INTRAVENOUS | Status: AC
  Administered 2018-07-20: 2 g via INTRAVENOUS
  Filled 2018-07-20: qty 2000

## 2018-07-20 MED ORDER — ONDANSETRON HCL 4 MG/2ML IJ SOLN: 4.0000 mg | Freq: Four times a day (QID) | INTRAMUSCULAR | Status: DC | PRN

## 2018-07-20 MED ORDER — MISOPROSTOL 50MCG HALF TABLET
50.0000 ug | ORAL_TABLET | ORAL | Status: DC
  Administered 2018-07-20 – 2018-07-21 (×3): 50 ug via VAGINAL
  Filled 2018-07-20 (×3): qty 1

## 2018-07-20 MED ORDER — SODIUM CHLORIDE 0.9% FLUSH: 3.0000 mL | Freq: Two times a day (BID) | INTRAVENOUS | Status: DC

## 2018-07-20 MED ORDER — SODIUM CHLORIDE 0.9 % IV SOLN: 250.0000 mL | INTRAVENOUS | Status: DC | PRN

## 2018-07-20 MED ORDER — LACTATED RINGERS IV SOLN
INTRAVENOUS | Status: DC
  Administered 2018-07-20: 16:00:00 via INTRAVENOUS

## 2018-07-20 MED ORDER — OXYTOCIN 10 UNIT/ML IJ SOLN
INTRAMUSCULAR | Status: AC
  Filled 2018-07-20: qty 2

## 2018-07-20 NOTE — Progress Notes (Signed)
ROB&NST- pt is having some pelvic pressure

## 2018-07-20 NOTE — H&P (Signed)
Obstetric History and Physical  Kristen Powers is a 28 y.o. G3P1011 with IUP at [redacted]w[redacted]d presenting for elective induction. Patient states she has been having  irregular, every 6-10 minutes contractions, none vaginal bleeding, intact membranes, with active fetal movement.    Prenatal Course Source of Care: [facility]  Pregnancy complications or risks:s/p thyroidectomy  Prenatal labs and studies: ABO, Rh: O/Negative/-- (10/29 0947) Antibody: Negative (10/29 0951) Rubella: 1.65 (10/29 0947) RPR: Non Reactive (03/17 0954)  HBsAg: Negative (10/29 0947)  HIV: Non Reactive (10/29 0947)  VQM:GQQPYPPJ (04/28 1355) 1 hr Glucola  normal Genetic screening normal Anatomy US normal  Past Medical History:  Diagnosis Date  . Breastfeeding (infant) Jul 20, 2014  . Headache   . Thyroid disease     Past Surgical History:  Procedure Laterality Date  . THYROIDECTOMY N/A 08/06/2014   Procedure: THYROIDECTOMY;  Surgeon: Carloyn Manner, MD;  Location: [facility] ORS;  Service: ENT;  Laterality: N/A;  . TYMPANOSTOMY TUBE PLACEMENT      OB History  Gravida Para Term Preterm AB Living  3 1 1  0 1 1  SAB TAB Ectopic Multiple Live Births  1       1    # Outcome Date GA Lbr Len/2nd Weight Sex Delivery Anes PTL Lv  3 Current           2 SAB 2019          1 Term 11/24/13    F    LIV    Social History   Socioeconomic History  . Marital status: Married    Spouse name: Not on file  . Number of children: Not on file  . Years of education: Not on file  . Highest education level: Not on file  Occupational History  . Not on file  Social Needs  . Financial resource strain: Not on file  . Food insecurity:    Worry: Not on file    Inability: Not on file  . Transportation needs:    Medical: Not on file    Non-medical: Not on file  Tobacco Use  . Smoking status: Never Smoker  . Smokeless tobacco: Never Used  Substance and Sexual Activity  . Alcohol use: No  . Drug use: No  . Sexual activity: Yes    Birth control/protection: None  Lifestyle  . Physical activity:    Days per week: Not on file    Minutes per session: Not on file  . Stress: Not on file  Relationships  . Social connections:    Talks on phone: Not on file    Gets together: Not on file    Attends religious service: Not on file    Active member of club or organization: Not on file    Attends meetings of clubs or organizations: Not on file    Relationship status: Not on file  Other Topics Concern  . Not on file  Social History Narrative  . Not on file    Family History  Problem Relation Age of Onset  . Cancer Mother        thyroid  . Diabetes Father   . Breast cancer Neg Hx   . Ovarian cancer Neg Hx   . Colon cancer Neg Hx     Medications Prior to Admission  Medication Sig Dispense Refill Last Dose  . levothyroxine (SYNTHROID) 175 MCG tablet Take 1 tablet (175 mcg total) by mouth daily before breakfast. 30 tablet 2 Taking  . prenatal vitamin w/FE,  FA (PRENATAL 1 + 1) 27-1 MG TABS tablet Take 1 tablet by mouth daily at 12 noon.   Taking    Allergies  Allergen Reactions  . Latex Swelling and Rash  . Topamax [Topiramate]     Other reaction(s): Unknown    Review of Systems: Negative except for what is mentioned in HPI.  Physical Exam: LMP 10/13/2017  GENERAL: Well-developed, well-nourished female in no acute distress.  LUNGS: Clear to auscultation bilaterally.  HEART: Regular rate and rhythm. ABDOMEN: Soft, nontender, nondistended, gravid. EXTREMITIES: Nontender, no edema, 2+ distal pulses. Cervical Exam:   FHT:  Baseline rate 122 bpm   Variability moderate  Accelerations present   Decelerations none Contractions: none noted   Pertinent Labs/Studies:   Results for orders placed or performed in visit on 07/20/18 (from the past 24 hour(s))  POC Urinalysis Dipstick OB     Status: Abnormal   Collection Time: 07/20/18  9:34 AM  Result Value Ref Range   Color, UA yellow    Clarity, UA clear     Glucose, UA Negative Negative   Bilirubin, UA neg    Ketones, UA neg    Spec Grav, UA 1.010 1.010 - 1.025   Blood, UA neg    pH, UA 6.5 5.0 - 8.0   POC,PROTEIN,UA Negative Negative, Trace, Small (1+), Moderate (2+), Large (3+), 4+   Urobilinogen, UA 0.2 0.2 or 1.0 E.U./dL   Nitrite, UA neg    Leukocytes, UA Small (1+) (A) Negative   Appearance     Odor      Assessment : Kristen Powers is a 28 y.o. G3P1011 at [redacted]w[redacted]d being admitted for elective induction of labor.  Plan: covid testing obtained- negative for s/s.  Labor: Expectant management.  Induction with cytotec, per protocol FWB: Reassuring fetal heart tracing.  GBS positive Delivery plan: Hopeful for vaginal delivery   , CNM [facility]

## 2018-07-20 NOTE — Progress Notes (Signed)
ROB and NST with growth scan today, NST reactive, will plan IOL on June 1 if not delivered by then. Growth scan today with AFI 8cm and EFW 8#7oz, BPP 6/8- sent to L&D for elective IOL with cytotec.

## 2018-07-21 ENCOUNTER — Inpatient Hospital Stay: Payer: BLUE CROSS/BLUE SHIELD | Admitting: Anesthesiology

## 2018-07-21 ENCOUNTER — Encounter: Payer: Self-pay | Admitting: Anesthesiology

## 2018-07-21 DIAGNOSIS — O99824 Streptococcus B carrier state complicating childbirth: Secondary | ICD-10-CM

## 2018-07-21 LAB — RPR: RPR Ser Ql: NONREACTIVE

## 2018-07-21 MED ORDER — FENTANYL 2.5 MCG/ML W/ROPIVACAINE 0.15% IN NS 100 ML EPIDURAL (ARMC)
12.0000 mL/h | EPIDURAL | Status: DC
  Administered 2018-07-21: 12 mL/h via EPIDURAL

## 2018-07-21 MED ORDER — SIMETHICONE 80 MG PO CHEW: 80.0000 mg | CHEWABLE_TABLET | ORAL | Status: DC | PRN

## 2018-07-21 MED ORDER — LACTATED RINGERS IV SOLN: 500.0000 mL | Freq: Once | INTRAVENOUS | Status: DC

## 2018-07-21 MED ORDER — PHENYLEPHRINE 40 MCG/ML (10ML) SYRINGE FOR IV PUSH (FOR BLOOD PRESSURE SUPPORT): 80.0000 ug | PREFILLED_SYRINGE | INTRAVENOUS | Status: DC | PRN

## 2018-07-21 MED ORDER — BENZOCAINE-MENTHOL 20-0.5 % EX AERO: 1.0000 "application " | INHALATION_SPRAY | CUTANEOUS | Status: DC | PRN

## 2018-07-21 MED ORDER — SENNOSIDES-DOCUSATE SODIUM 8.6-50 MG PO TABS: 2.0000 | ORAL_TABLET | ORAL | Status: DC

## 2018-07-21 MED ORDER — ONDANSETRON HCL 4 MG PO TABS: 4.0000 mg | ORAL_TABLET | ORAL | Status: DC | PRN

## 2018-07-21 MED ORDER — LEVOTHYROXINE SODIUM 175 MCG PO TABS
175.0000 ug | ORAL_TABLET | Freq: Every day | ORAL | Status: DC
  Administered 2018-07-22: 175 ug via ORAL
  Filled 2018-07-21: qty 1

## 2018-07-21 MED ORDER — PRENATAL MULTIVITAMIN CH: 1.0000 | ORAL_TABLET | Freq: Every day | ORAL | Status: DC

## 2018-07-21 MED ORDER — ONDANSETRON HCL 4 MG/2ML IJ SOLN: 4.0000 mg | INTRAMUSCULAR | Status: DC | PRN

## 2018-07-21 MED ORDER — DIBUCAINE (PERIANAL) 1 % EX OINT: 1.0000 "application " | TOPICAL_OINTMENT | CUTANEOUS | Status: DC | PRN

## 2018-07-21 MED ORDER — COCONUT OIL OIL
1.0000 "application " | TOPICAL_OIL | Status: DC | PRN
  Administered 2018-07-21: 1 via TOPICAL
  Filled 2018-07-21: qty 120

## 2018-07-21 MED ORDER — ONDANSETRON HCL 4 MG/2ML IJ SOLN: 4.0000 mg | Freq: Once | INTRAMUSCULAR | Status: DC | PRN

## 2018-07-21 MED ORDER — EPHEDRINE 5 MG/ML INJ: 10.0000 mg | INTRAVENOUS | Status: DC | PRN

## 2018-07-21 MED ORDER — IBUPROFEN 600 MG PO TABS
600.0000 mg | ORAL_TABLET | Freq: Four times a day (QID) | ORAL | Status: DC
  Administered 2018-07-21 – 2018-07-22 (×3): 600 mg via ORAL
  Filled 2018-07-21 (×3): qty 1

## 2018-07-21 MED ORDER — FENTANYL CITRATE (PF) 100 MCG/2ML IJ SOLN: 25.0000 ug | INTRAMUSCULAR | Status: DC | PRN

## 2018-07-21 MED ORDER — LIDOCAINE-EPINEPHRINE (PF) 1.5 %-1:200000 IJ SOLN
INTRAMUSCULAR | Status: DC | PRN
  Administered 2018-07-21: 3 mL via PERINEURAL

## 2018-07-21 MED ORDER — FENTANYL 2.5 MCG/ML W/ROPIVACAINE 0.15% IN NS 100 ML EPIDURAL (ARMC)
EPIDURAL | Status: AC
  Filled 2018-07-21: qty 100

## 2018-07-21 MED ORDER — SODIUM CHLORIDE 0.9 % IV SOLN: 1.0000 g | INTRAVENOUS | Status: DC

## 2018-07-21 MED ORDER — ACETAMINOPHEN 325 MG PO TABS: 650.0000 mg | ORAL_TABLET | ORAL | Status: DC | PRN

## 2018-07-21 MED ORDER — DIPHENHYDRAMINE HCL 50 MG/ML IJ SOLN: 12.5000 mg | INTRAMUSCULAR | Status: DC | PRN

## 2018-07-21 MED ORDER — DIPHENHYDRAMINE HCL 25 MG PO CAPS: 25.0000 mg | ORAL_CAPSULE | Freq: Four times a day (QID) | ORAL | Status: DC | PRN

## 2018-07-21 MED ORDER — LIDOCAINE HCL (PF) 1 % IJ SOLN
INTRAMUSCULAR | Status: DC | PRN
  Administered 2018-07-21: 3 mL

## 2018-07-21 MED ORDER — WITCH HAZEL-GLYCERIN EX PADS: 1.0000 "application " | MEDICATED_PAD | CUTANEOUS | Status: DC | PRN

## 2018-07-21 MED ORDER — BUPIVACAINE HCL (PF) 0.25 % IJ SOLN
INTRAMUSCULAR | Status: DC | PRN
  Administered 2018-07-21: 10 mL via EPIDURAL

## 2018-07-21 NOTE — Anesthesia Procedure Notes (Signed)
Epidural Patient location during procedure: OB Start time: 07/21/2018 5:45 AM End time: 07/21/2018 6:01 AM  Staffing Anesthesiologist: Alvin Critchley, MD Performed: anesthesiologist   Preanesthetic Checklist Completed: patient identified, site marked, surgical consent, pre-op evaluation, timeout performed, IV checked, risks and benefits discussed and monitors and equipment checked  Epidural Patient position: sitting Prep: Betadine Patient monitoring: heart rate, continuous pulse ox and blood pressure Approach: midline Location: L3-L4 Injection technique: LOR air  Needle:  Needle type: Tuohy  Needle gauge: 17 G Needle length: 9 cm and 9 Catheter type: closed end flexible Catheter size: 19 Gauge Test dose: negative and 1.5% lidocaine with Epi 1:200 K  Assessment Sensory level: T8 Events: blood not aspirated, injection not painful, no injection resistance, negative IV test and no paresthesia  Additional Notes Time out called .  Patient placed in sitting position.  Back prepped and draped in sterile fashion.  A skin wheal was made in the L3-L4 interspace with 1% lidocaine plain.  The 17G Tuohy needle was advanced into the epidural space by a loss of resistance technique.  No blood or paresthesias.  The epidural catheter was threaded 5 cm and then pulled back to 3cm and the TD was negative.  The patient tolerated the procedure well and the catheter was affixed to the back in sterile fashion.Reason for block:procedure for pain

## 2018-07-21 NOTE — Anesthesia Preprocedure Evaluation (Signed)
Anesthesia Evaluation  Patient identified by MRN, date of birth, ID band Patient awake    Reviewed: Allergy & Precautions, NPO status , Patient's Chart, lab work & pertinent test results  Airway Mallampati: I  TM Distance: >3 FB Neck ROM: Full    Dental  (+) Teeth Intact   Pulmonary neg pulmonary ROS,    Pulmonary exam normal        Cardiovascular Exercise Tolerance: Good negative cardio ROS Normal cardiovascular exam     Neuro/Psych  Headaches, negative psych ROS   GI/Hepatic negative GI ROS, Neg liver ROS,   Endo/Other  Hypothyroidism   Renal/GU negative Renal ROS  negative genitourinary   Musculoskeletal negative musculoskeletal ROS (+)   Abdominal Normal abdominal exam  (+)   Peds negative pediatric ROS (+)  Hematology  (+) anemia ,   Anesthesia Other Findings   Reproductive/Obstetrics (+) Pregnancy                             Anesthesia Physical  Anesthesia Plan  ASA: II  Anesthesia Plan: Epidural   Post-op Pain Management:    Induction:   PONV Risk Score and Plan:   Airway Management Planned: Natural Airway  Additional Equipment:   Intra-op Plan:   Post-operative Plan:   Informed Consent: I have reviewed the patients History and Physical, chart, labs and discussed the procedure including the risks, benefits and alternatives for the proposed anesthesia with the patient or authorized representative who has indicated his/her understanding and acceptance.       Plan Discussed with: CRNA  Anesthesia Plan Comments:         Anesthesia Quick Evaluation

## 2018-07-22 LAB — TYPE AND SCREEN
ABO/RH(D): O NEG
Antibody Screen: POSITIVE
Unit division: 0
Unit division: 0

## 2018-07-22 LAB — CBC
HCT: 29.4 % — ABNORMAL LOW (ref 36.0–46.0)
Hemoglobin: 9.7 g/dL — ABNORMAL LOW (ref 12.0–15.0)
MCH: 29.9 pg (ref 26.0–34.0)
MCHC: 33 g/dL (ref 30.0–36.0)
MCV: 90.7 fL (ref 80.0–100.0)
Platelets: 203 10*3/uL (ref 150–400)
RBC: 3.24 MIL/uL — ABNORMAL LOW (ref 3.87–5.11)
RDW: 13.2 % (ref 11.5–15.5)
WBC: 14.9 10*3/uL — ABNORMAL HIGH (ref 4.0–10.5)
nRBC: 0 % (ref 0.0–0.2)

## 2018-07-22 LAB — BPAM RBC
Blood Product Expiration Date: 202006212359
Blood Product Expiration Date: 202006242359
Unit Type and Rh: 9500
Unit Type and Rh: 9500

## 2018-07-22 LAB — FETAL SCREEN: Fetal Screen: NEGATIVE

## 2018-07-22 MED ORDER — FUSION PLUS PO CAPS: 1.0000 | ORAL_CAPSULE | Freq: Every day | ORAL | 1 refills | Status: DC

## 2018-07-22 MED ORDER — RHO D IMMUNE GLOBULIN 1500 UNIT/2ML IJ SOSY
300.0000 ug | PREFILLED_SYRINGE | Freq: Once | INTRAMUSCULAR | Status: AC
  Administered 2018-07-22: 300 ug via INTRAVENOUS
  Filled 2018-07-22: qty 2

## 2018-07-22 MED ORDER — VITAMIN D3 125 MCG (5000 UT) PO CAPS: 1.0000 | ORAL_CAPSULE | Freq: Every day | ORAL | 2 refills | Status: DC

## 2018-07-22 NOTE — Progress Notes (Signed)
Patient discharged home with infant. FOB present at discharge. Discharge instructions and prescriptions given and reviewed with patient. Patient verbalized understanding. Escorted out by staff.

## 2018-07-22 NOTE — Discharge Summary (Signed)
Physician Obstetric Discharge Summary  Patient ID: Kristen Powers MRN: [id number] DOB/AGE: 1990-04-24 28 y.o.   Date of Admission: 07/20/2018  Date of Discharge:   Admitting Diagnosis: Induction of labor at [redacted]w[redacted]d  Secondary Diagnosis: none  Mode of Delivery: normal spontaneous vaginal delivery     Discharge Diagnosis: SVD at 40 weeks    Intrapartum Procedures: cytotec IOL, epidural   Post partum procedures: rhogam  Complications: PP anemia   Brief Hospital Course  Kristen Powers is a [id number] who had a SVD on 07/21/2018;  for further details of this, please refer to the delivey note.  Patient had an uncomplicated postpartum course.  By time of discharge on PPD#1, her pain was controlled on oral pain medications; she had appropriate lochia and was ambulating, voiding without difficulty and tolerating regular diet.  She was deemed stable for discharge to home.     Labs: CBC Latest Ref Rng & Units 07/22/2018 07/20/2018 06/21/2018  WBC 4.0 - 10.5 K/uL 14.9(H) 10.4 10.7  Hemoglobin 12.0 - 15.0 g/dL 9.7(L) 10.8(L) 10.6(L)  Hematocrit 36.0 - 46.0 % 29.4(L) 32.1(L) 30.9(L)  Platelets 150 - 400 K/uL 203 232 211   O NEG Performed at [facility], [address] [place], [place] [address]   Physical exam:  Blood pressure 113/83, pulse 73, temperature 98 F (36.7 C), temperature source Oral, resp. rate 16, height 5\' 6"  (1.676 m), weight 93 kg, last menstrual period 10/13/2017, SpO2 98 %, unknown if currently breastfeeding. General: alert and no distress Lochia: appropriate Abdomen: soft, NT Uterine Fundus: firm Extremities: No evidence of DVT seen on physical exam. No lower extremity edema.  Discharge Instructions: Per After Visit Summary. Activity: Advance as tolerated. Pelvic rest for 6 weeks.  Also refer to After Visit Summary Diet: Regular Medications: Allergies as of 07/22/2018      Reactions   Latex Swelling, Rash   Topamax [topiramate]    Other  reaction(s): Unknown      Medication List    TAKE these medications   Fusion Plus Caps Take 1 capsule by mouth daily.   levothyroxine 175 MCG tablet Commonly known as:  SYNTHROID Take 1 tablet (175 mcg total) by mouth daily before breakfast.   prenatal vitamin w/FE, FA 27-1 MG Tabs tablet Take 1 tablet by mouth daily at 12 noon.   Vitamin D3 125 MCG (5000 UT) Caps Take 1 capsule (5,000 Units total) by mouth daily.      Outpatient follow up:  Postpartum contraception: condoms, vasectomy  Discharged Condition: good  Discharged to: home   Newborn Data: Disposition:home with mother  Apgars: APGAR (1 MIN): 8   APGAR (5 MINS): 8   APGAR (10 MINS):    Baby Feeding: Bottle and Breast  Melody Rockney Ghee, CNM

## 2018-07-22 NOTE — Lactation Note (Signed)
This note was copied from a baby's chart. Lactation Consultation Note  Patient Name: Kristen Powers Today's Date: 07/22/2018 Reason for consult: Follow-up assessment   Maternal Data Formula Feeding for Exclusion: No Does the patient have breastfeeding experience prior to this delivery?: Yes breastfed 6 mths, then mom pumped x 6 mths Feeding Feeding Type: Breast Fed  LATCH Score   No breastfeeding observed, baby sleepy after circ, mom stimulating baby to wake up, baby has had no problems latching per mom                Interventions    Lactation Tools Discussed/Used WIC Program: No   Consult Status Consult Status: PRN    Ferol Luz 07/22/2018, 12:32 PM

## 2018-07-22 NOTE — Discharge Instructions (Signed)
Discharge Instructions:   Follow-up Appointment: Schedule follow-up appointment in 6 weeks with Melody Shambley at [facility].   If there are any new medications, they have been ordered and will be available for pickup at the listed pharmacy on your way home from the hospital.   Call office if you have any of the following: headache, visual changes, fever >101.0 F, chills, shortness of breath, breast concerns, excessive vaginal bleeding, incision drainage or problems, leg pain or redness, depression or any other concerns. If you have vaginal discharge with an odor, let your doctor know.   It is normal to bleed for up to 6 weeks. You should not soak through more than 1 pad in 1 hour. If you have a blood clot larger than your fist with continued bleeding, call your doctor.   Activity: Do not lift > 10 lbs for 6 weeks (do not lift anything heavier than your baby). No intercourse, tampons, swimming pools, hot tubs, baths (only showers) for 6 weeks.  No driving for 1-2 weeks. Continue prenatal vitamin, especially if breastfeeding. Increase calories and fluids (water) while breastfeeding.   Your milk will come in, in the next couple of days (right now it is colostrum). You may have a slight fever when your milk comes in, but it should go away on its own.  If it does not, and rises above 101 F please call the doctor. You will also feel achy and your breasts will be firm. They will also start to leak. If you are breastfeeding, continue as you have been and you can pump/express milk for comfort.   If you have too much milk, your breasts can become engorged, which could lead to mastitis. This is an infection of the milk ducts. It can be very painful and you will need to notify your doctor to obtain a prescription for antibiotics. You can also treat it with a shower or hot/cold compress.   For concerns about your baby, please call your pediatrician.  For breastfeeding concerns, the lactation  consultant can be reached at [phone].   Postpartum blues (feelings of happy one minute and sad another minute) are normal for the first few weeks but if it gets worse let your doctor know.   Congratulations! We enjoyed caring for you and your new bundle of joy!

## 2018-07-22 NOTE — Anesthesia Postprocedure Evaluation (Signed)
Anesthesia Post Note  Patient: Kristen Powers  Procedure(s) Performed: AN AD HOC LABOR EPIDURAL  Patient location during evaluation: Mother Baby Anesthesia Type: Epidural Level of consciousness: awake and alert Pain management: pain level controlled Vital Signs Assessment: post-procedure vital signs reviewed and stable Respiratory status: spontaneous breathing, nonlabored ventilation and respiratory function stable Cardiovascular status: stable Postop Assessment: no headache, no backache and epidural receding Anesthetic complications: no     Last Vitals:  Vitals:   07/22/18 0315 07/22/18 0900  BP: 113/83 127/83  Pulse: 73 91  Resp: 16 17  Temp: 36.7 C 36.4 C  SpO2: 98% 100%    Last Pain:  Vitals:   07/22/18 0900  TempSrc: Oral  PainSc: 0-No pain                 Raeana Blinn Lorenza Chick

## 2018-07-23 LAB — RHOGAM INJECTION: Unit division: 0

## 2018-08-03 ENCOUNTER — Other Ambulatory Visit: Payer: Self-pay | Admitting: *Deleted

## 2018-08-03 MED ORDER — NITROFURANTOIN MONOHYD MACRO 100 MG PO CAPS: 100.0000 mg | ORAL_CAPSULE | Freq: Two times a day (BID) | ORAL | 0 refills | Status: DC

## 2018-08-12 ENCOUNTER — Other Ambulatory Visit: Payer: Self-pay

## 2018-08-12 MED ORDER — DICLOXACILLIN SODIUM 500 MG PO CAPS: 500.0000 mg | ORAL_CAPSULE | Freq: Four times a day (QID) | ORAL | 0 refills | Status: DC

## 2018-09-02 ENCOUNTER — Other Ambulatory Visit: Payer: Self-pay

## 2018-09-02 ENCOUNTER — Ambulatory Visit (INDEPENDENT_AMBULATORY_CARE_PROVIDER_SITE_OTHER): Payer: 59 | Admitting: Obstetrics and Gynecology

## 2018-09-02 ENCOUNTER — Other Ambulatory Visit: Payer: Self-pay | Admitting: Obstetrics and Gynecology

## 2018-09-02 ENCOUNTER — Encounter: Payer: Self-pay | Admitting: Obstetrics and Gynecology

## 2018-09-02 VITALS — BP 101/76 | HR 89 | Ht 66.0 in | Wt 173.5 lb

## 2018-09-02 DIAGNOSIS — R300 Dysuria: Secondary | ICD-10-CM | POA: Diagnosis not present

## 2018-09-02 DIAGNOSIS — E890 Postprocedural hypothyroidism: Secondary | ICD-10-CM

## 2018-09-02 LAB — POCT URINALYSIS DIPSTICK
Bilirubin, UA: POSITIVE
Glucose, UA: NEGATIVE
Ketones, UA: NEGATIVE
Nitrite, UA: NEGATIVE
Odor: NEGATIVE
Protein, UA: NEGATIVE
Spec Grav, UA: 1.025 (ref 1.010–1.025)
Urobilinogen, UA: 0.2 E.U./dL
pH, UA: 6 (ref 5.0–8.0)

## 2018-09-02 MED ORDER — NORETHINDRONE 0.35 MG PO TABS: 1.0000 | ORAL_TABLET | Freq: Every day | ORAL | 11 refills | Status: DC

## 2018-09-02 NOTE — Progress Notes (Signed)
  Subjective:     Kristen Powers is a 28 y.o. female who presents for a postpartum visit. She is 6 weeks postpartum following a spontaneous vaginal delivery. I have fully reviewed the prenatal and intrapartum course. The delivery was at 40 gestational weeks. Outcome: spontaneous vaginal delivery. Anesthesia: epidural. Postpartum course has been uncomplicated. Baby's course has been uncomplicated. Baby is feeding by breast. Bleeding no bleeding. Bowel function is normal. Bladder function is normal. Patient is not sexually active. Contraception method is abstinence. Postpartum depression screening: negative.  The following portions of the patient's history were reviewed and updated as appropriate: allergies, current medications, past family history, past medical history, past social history, past surgical history and problem list.  Review of Systems A comprehensive review of systems was negative.   Objective:    BP 101/76   Pulse 89   Ht 5\' 6"  (1.676 m)   Wt 173 lb 8 oz (78.7 kg)   LMP  (LMP Unknown)   Breastfeeding Yes   BMI 28.00 kg/m   General:  alert, cooperative and appears stated age   Breasts:  inspection negative, no nipple discharge or bleeding, no masses or nodularity palpable  Lungs: clear to auscultation bilaterally  Heart:  regular rate and rhythm, S1, S2 normal, no murmur, click, rub or gallop  Abdomen: soft, non-tender; bowel sounds normal; no masses,  no organomegaly   Vulva:  normal  Vagina: normal vagina, no discharge, exudate, lesion, or erythema  Cervix:  multiparous appearance  Corpus: normal size, contour, position, consistency, mobility, non-tender  Adnexa:  no mass, fullness, tenderness  Rectal Exam: no masses, tenderness, nodules        Urinalysis    Component Value Date/Time   APPEARANCEUR Clear 12/21/2017 1046   GLUCOSEU Negative 07/20/2018 0934   BILIRUBINUR pos 09/02/2018 1052   BILIRUBINUR Negative 12/21/2017 1046   PROTEINUR Negative  09/02/2018 1052   PROTEINUR Negative 12/21/2017 1046   UROBILINOGEN 0.2 09/02/2018 1052   NITRITE neg 09/02/2018 1052   NITRITE Negative 12/21/2017 1046   LEUKOCYTESUR Trace (A) 09/02/2018 1052   LEUKOCYTESUR Negative 12/21/2017 1046    Assessment:     6 weeks postpartum exam. Pap smear not done at today's visit  dysuria.   Plan:    1. Contraception: oral progesterone-only contraceptive 2. Labs obtained-will follow up accordingly.urine sent for culture. 3. Follow up in: 3 months for Annual or as needed.

## 2018-09-03 LAB — CBC
Hematocrit: 40.9 % (ref 34.0–46.6)
Hemoglobin: 13.4 g/dL (ref 11.1–15.9)
MCH: 29.3 pg (ref 26.6–33.0)
MCHC: 32.8 g/dL (ref 31.5–35.7)
MCV: 90 fL (ref 79–97)
Platelets: 256 10*3/uL (ref 150–450)
RBC: 4.57 x10E6/uL (ref 3.77–5.28)
RDW: 14.3 % (ref 11.7–15.4)
WBC: 6 10*3/uL (ref 3.4–10.8)

## 2018-09-03 LAB — THYROID PANEL WITH TSH
Free Thyroxine Index: 4.2 (ref 1.2–4.9)
T3 Uptake Ratio: 35 % (ref 24–39)
T4, Total: 11.9 ug/dL (ref 4.5–12.0)
TSH: 0.027 u[IU]/mL — ABNORMAL LOW (ref 0.450–4.500)

## 2018-09-03 LAB — FERRITIN: Ferritin: 39 ng/mL (ref 15–150)

## 2018-09-03 LAB — VITAMIN D 25 HYDROXY (VIT D DEFICIENCY, FRACTURES): Vit D, 25-Hydroxy: 38.9 ng/mL (ref 30.0–100.0)

## 2018-09-04 LAB — URINE CULTURE

## 2018-09-06 ENCOUNTER — Other Ambulatory Visit: Payer: Self-pay | Admitting: Obstetrics and Gynecology

## 2018-09-06 MED ORDER — AMOXICILLIN 500 MG PO CAPS: 500.0000 mg | ORAL_CAPSULE | Freq: Three times a day (TID) | ORAL | 2 refills | Status: DC

## 2018-09-17 ENCOUNTER — Other Ambulatory Visit: Payer: Self-pay | Admitting: Obstetrics and Gynecology

## 2018-09-26 ENCOUNTER — Other Ambulatory Visit: Payer: Self-pay | Admitting: *Deleted

## 2018-09-26 ENCOUNTER — Other Ambulatory Visit (INDEPENDENT_AMBULATORY_CARE_PROVIDER_SITE_OTHER): Payer: 59

## 2018-09-26 ENCOUNTER — Other Ambulatory Visit: Payer: Self-pay

## 2018-09-26 DIAGNOSIS — R300 Dysuria: Secondary | ICD-10-CM

## 2018-09-26 LAB — POCT URINALYSIS DIPSTICK
Bilirubin, UA: NEGATIVE
Blood, UA: NEGATIVE
Glucose, UA: NEGATIVE
Ketones, UA: NEGATIVE
Nitrite, UA: NEGATIVE
Protein, UA: POSITIVE — AB
Spec Grav, UA: 1.015 (ref 1.010–1.025)
Urobilinogen, UA: 0.2 E.U./dL
pH, UA: 7 (ref 5.0–8.0)

## 2018-09-26 MED ORDER — PHENAZOPYRIDINE HCL 200 MG PO TABS: 200.0000 mg | ORAL_TABLET | Freq: Three times a day (TID) | ORAL | 1 refills | Status: DC | PRN

## 2018-09-28 ENCOUNTER — Other Ambulatory Visit: Payer: Self-pay | Admitting: Obstetrics and Gynecology

## 2018-09-28 LAB — URINE CULTURE

## 2018-09-28 MED ORDER — CIPROFLOXACIN HCL 500 MG PO TABS: 500.0000 mg | ORAL_TABLET | Freq: Two times a day (BID) | ORAL | 0 refills | Status: DC

## 2018-12-06 ENCOUNTER — Encounter: Payer: Self-pay | Admitting: Obstetrics and Gynecology

## 2018-12-06 ENCOUNTER — Ambulatory Visit (INDEPENDENT_AMBULATORY_CARE_PROVIDER_SITE_OTHER): Payer: 59 | Admitting: Obstetrics and Gynecology

## 2018-12-06 ENCOUNTER — Other Ambulatory Visit: Payer: Self-pay

## 2018-12-06 VITALS — BP 105/80 | HR 82 | Ht 66.0 in | Wt 170.1 lb

## 2018-12-06 DIAGNOSIS — Z01419 Encounter for gynecological examination (general) (routine) without abnormal findings: Secondary | ICD-10-CM | POA: Diagnosis not present

## 2018-12-06 DIAGNOSIS — E890 Postprocedural hypothyroidism: Secondary | ICD-10-CM | POA: Diagnosis not present

## 2018-12-06 NOTE — Progress Notes (Signed)
Subjective:   Kristen Powers is a 28 y.o. G20P2012 Caucasian female here for a routine well-woman exam.  No LMP recorded. (Menstrual status: Lactating).    Current complaints: noen PCP: me       Does need thyroid labs  Social History: Sexual: heterosexual Marital Status: married Living situation: with family Occupation: hairdresser Tobacco/alcohol: no tobacco use Illicit drugs: no history of illicit drug use  The following portions of the patient's history were reviewed and updated as appropriate: allergies, current medications, past family history, past medical history, past social history, past surgical history and problem list.  Past Medical History Past Medical History:  Diagnosis Date  . Breastfeeding (infant) Jul 20, 2014  . Headache   . Thyroid disease     Past Surgical History Past Surgical History:  Procedure Laterality Date  . THYROIDECTOMY N/A 08/06/2014   Procedure: THYROIDECTOMY;  Surgeon: Carloyn Manner, MD;  Location: [facility] ORS;  Service: ENT;  Laterality: N/A;  . TYMPANOSTOMY TUBE PLACEMENT      Gynecologic History [id number]  No LMP recorded. (Menstrual status: Lactating). Contraception: oral progesterone-only contraceptive Last Pap: 05/2017. Results were: normal  Obstetric History OB History  Gravida Para Term Preterm AB Living  3 2 2  0 1 2  SAB TAB Ectopic Multiple Live Births  1     0 2    # Outcome Date GA Lbr Len/2nd Weight Sex Delivery Anes PTL Lv  3 Term 07/21/18 [redacted]w[redacted]d 03:15 / 00:14 8 lb 11.3 oz (3.95 kg) M Vag-Spont EPI  LIV  2 SAB 2019          1 Term 11/24/13    F    LIV    Current Medications Current Outpatient Medications on File Prior to Visit  Medication Sig Dispense Refill  . levothyroxine (SYNTHROID) 175 MCG tablet TAKE 1 TABLET BY MOUTH EVERY DAY BEFORE BREAKFAST 30 tablet 2  . amoxicillin (AMOXIL) 500 MG capsule Take 1 capsule (500 mg total) by mouth 3 (three) times daily. (Patient not taking: Reported on 12/06/2018) 21  capsule 2  . ciprofloxacin (CIPRO) 500 MG tablet Take 1 tablet (500 mg total) by mouth 2 (two) times daily. (Patient not taking: Reported on 12/06/2018) 14 tablet 0  . norethindrone (MICRONOR) 0.35 MG tablet Take 1 tablet (0.35 mg total) by mouth daily. (Patient not taking: Reported on 12/06/2018) 1 Package 11  . phenazopyridine (PYRIDIUM) 200 MG tablet Take 1 tablet (200 mg total) by mouth 3 (three) times daily as needed for pain. 20 tablet 1   No current facility-administered medications on file prior to visit.     Review of Systems Patient denies any headaches, blurred vision, shortness of breath, chest pain, abdominal pain, problems with bowel movements, urination, or intercourse.  Objective:  BP 105/80   Pulse 82   Ht 5\' 6"  (1.676 m)   Wt 170 lb 1.6 oz (77.2 kg)   BMI 27.45 kg/m  Physical Exam  General:  Well developed, well nourished, no acute distress. She is alert and oriented x3. Skin:  Warm and dry Neck:  Midline trachea, no thyromegaly or nodules Cardiovascular: Regular rate and rhythm, no murmur heard Lungs:  Effort normal, all lung fields clear to auscultation bilaterally Breasts:  No dominant palpable mass, retraction, or nipple discharge Abdomen:  Soft, non tender, no hepatosplenomegaly or masses Pelvic:  External genitalia is normal in appearance.  The vagina is normal in appearance. The cervix is bulbous, no CMT.  Thin prep pap is not done. Uterus is  felt to be normal size, shape, and contour.  No adnexal masses or tenderness noted. Extremities:  No swelling or varicosities noted Psych:  She has a normal mood and affect  Assessment:   Healthy well-woman exam S/p thyroidectomy BMI 27  Plan:  Labs obtained- will follow up accordingly F/U 1 year for AE, or sooner if needed Declines flu vaccine.    Rockney Ghee, CNM

## 2018-12-06 NOTE — Progress Notes (Signed)
Patient comes in today for annual exam. She has not started having periods since she gave birth in May. She bleed one day about three weeks ago. No concerns today.

## 2018-12-06 NOTE — Patient Instructions (Signed)
Place annual gynecologic exam patient instructions here.

## 2018-12-07 LAB — THYROID PANEL WITH TSH
Free Thyroxine Index: 4.1 (ref 1.2–4.9)
T3 Uptake Ratio: 38 % (ref 24–39)
T4, Total: 10.8 ug/dL (ref 4.5–12.0)
TSH: <0.005 u[IU]/mL — ABNORMAL LOW (ref 0.450–4.500)

## 2018-12-13 ENCOUNTER — Telehealth: Payer: Self-pay

## 2018-12-13 NOTE — Telephone Encounter (Signed)
Sandy from Dr Othelia Pulling office called and left a message with [facility] regarding this patient. I attempted to return Sandys call but had to leave a voicemail message.

## 2018-12-13 NOTE — Telephone Encounter (Signed)
Sandy from Dr Othelia Pulling office called back to report Dr Othelia Pulling decreased patients synthroid to 165mcg and will repeat TSH in 2 months.

## 2019-10-23 DIAGNOSIS — Z20828 Contact with and (suspected) exposure to other viral communicable diseases: Secondary | ICD-10-CM | POA: Diagnosis not present

## 2019-12-12 ENCOUNTER — Other Ambulatory Visit: Payer: Self-pay

## 2019-12-12 ENCOUNTER — Ambulatory Visit (INDEPENDENT_AMBULATORY_CARE_PROVIDER_SITE_OTHER): Payer: 59 | Admitting: Certified Nurse Midwife

## 2019-12-12 ENCOUNTER — Encounter: Payer: Self-pay | Admitting: Certified Nurse Midwife

## 2019-12-12 VITALS — BP 106/75 | HR 68 | Ht 66.0 in | Wt 174.4 lb

## 2019-12-12 DIAGNOSIS — E038 Other specified hypothyroidism: Secondary | ICD-10-CM

## 2019-12-12 DIAGNOSIS — Z85850 Personal history of malignant neoplasm of thyroid: Secondary | ICD-10-CM | POA: Diagnosis not present

## 2019-12-12 DIAGNOSIS — Z01419 Encounter for gynecological examination (general) (routine) without abnormal findings: Secondary | ICD-10-CM | POA: Diagnosis not present

## 2019-12-12 DIAGNOSIS — E890 Postprocedural hypothyroidism: Secondary | ICD-10-CM | POA: Diagnosis not present

## 2019-12-12 DIAGNOSIS — J301 Allergic rhinitis due to pollen: Secondary | ICD-10-CM | POA: Diagnosis not present

## 2019-12-12 NOTE — Patient Instructions (Signed)
Preventive Care 21-31 Years Old, Female Preventive care refers to visits with your health care provider and lifestyle choices that can promote health and wellness. This includes:  A yearly physical exam. This may also be called an annual well check.  Regular dental visits and eye exams.  Immunizations.  Screening for certain conditions.  Healthy lifestyle choices, such as eating a healthy diet, getting regular exercise, not using drugs or products that contain nicotine and tobacco, and limiting alcohol use. What can I expect for my preventive care visit? Physical exam Your health care provider will check your:  Height and weight. This may be used to calculate body mass index (BMI), which tells if you are at a healthy weight.  Heart rate and blood pressure.  Skin for abnormal spots. Counseling Your health care provider may ask you questions about your:  Alcohol, tobacco, and drug use.  Emotional well-being.  Home and relationship well-being.  Sexual activity.  Eating habits.  Work and work environment.  Method of birth control.  Menstrual cycle.  Pregnancy history. What immunizations do I need?  Influenza (flu) vaccine  This is recommended every year. Tetanus, diphtheria, and pertussis (Tdap) vaccine  You may need a Td booster every 10 years. Varicella (chickenpox) vaccine  You may need this if you have not been vaccinated. Human papillomavirus (HPV) vaccine  If recommended by your health care provider, you may need three doses over 6 months. Measles, mumps, and rubella (MMR) vaccine  You may need at least one dose of MMR. You may also need a second dose. Meningococcal conjugate (MenACWY) vaccine  One dose is recommended if you are age 19-21 years and a first-year college student living in a residence hall, or if you have one of several medical conditions. You may also need additional booster doses. Pneumococcal conjugate (PCV13) vaccine  You may need  this if you have certain conditions and were not previously vaccinated. Pneumococcal polysaccharide (PPSV23) vaccine  You may need one or two doses if you smoke cigarettes or if you have certain conditions. Hepatitis A vaccine  You may need this if you have certain conditions or if you travel or work in places where you may be exposed to hepatitis A. Hepatitis B vaccine  You may need this if you have certain conditions or if you travel or work in places where you may be exposed to hepatitis B. Haemophilus influenzae type b (Hib) vaccine  You may need this if you have certain conditions. You may receive vaccines as individual doses or as more than one vaccine together in one shot (combination vaccines). Talk with your health care provider about the risks and benefits of combination vaccines. What tests do I need?  Blood tests  Lipid and cholesterol levels. These may be checked every 5 years starting at age 20.  Hepatitis C test.  Hepatitis B test. Screening  Diabetes screening. This is done by checking your blood sugar (glucose) after you have not eaten for a while (fasting).  Sexually transmitted disease (STD) testing.  BRCA-related cancer screening. This may be done if you have a family history of breast, ovarian, tubal, or peritoneal cancers.  Pelvic exam and Pap test. This may be done every 3 years starting at age 21. Starting at age 30, this may be done every 5 years if you have a Pap test in combination with an HPV test. Talk with your health care provider about your test results, treatment options, and if necessary, the need for more tests.   Follow these instructions at home: Eating and drinking   Eat a diet that includes fresh fruits and vegetables, whole grains, lean protein, and low-fat dairy.  Take vitamin and mineral supplements as recommended by your health care provider.  Do not drink alcohol if: ? Your health care provider tells you not to drink. ? You are  pregnant, may be pregnant, or are planning to become pregnant.  If you drink alcohol: ? Limit how much you have to 0-1 drink a day. ? Be aware of how much alcohol is in your drink. In the [place] one drink equals one 12 oz bottle of beer (355 mL), one 5 oz glass of wine (148 mL), or one 1 oz glass of hard liquor (44 mL). Lifestyle  Take daily care of your teeth and gums.  Stay active. Exercise for at least 30 minutes on 5 or more days each week.  Do not use any products that contain nicotine or tobacco, such as cigarettes, e-cigarettes, and chewing tobacco. If you need help quitting, ask your health care provider.  If you are sexually active, practice safe sex. Use a condom or other form of birth control (contraception) in order to prevent pregnancy and STIs (sexually transmitted infections). If you plan to become pregnant, see your health care provider for a preconception visit. What's next?  Visit your health care provider once a year for a well check visit.  Ask your health care provider how often you should have your eyes and teeth checked.  Stay up to date on all vaccines. This information is not intended to replace advice given to you by your health care provider. Make sure you discuss any questions you have with your health care provider. Document Revised: 10/21/2017 Document Reviewed: 10/21/2017 Elsevier Patient Education  2020 [employer].

## 2019-12-12 NOTE — Progress Notes (Signed)
GYNECOLOGY ANNUAL PREVENTATIVE CARE ENCOUNTER NOTE  History:     Kristen Powers is a 29 y.o. [id number] female here for a routine annual gynecologic exam.  Current complaints: none.   Denies abnormal vaginal bleeding, discharge, pelvic pain, problems with intercourse or other gynecologic concerns.     Social Relationship:Married Living: spouse and children Work:hairdresser Exercise: [place] body Smoke/Alcohol/drug [id number]   Gynecologic History Patient's last menstrual period was 12/02/2019 (exact date). Contraception: vasectomy Last Pap: 06/12/2018. Results were: normal  Last mammogram: n/a    Upstream - 12/12/19 0946      Pregnancy Intention Screening   Does the patient want to become pregnant in the next year? No    Does the patient's partner want to become pregnant in the next year? No    Would the patient like to discuss contraceptive options today? No      Contraception Wrap Up   Current Method Vasectomy    Contraception Counseling Provided No          The pregnancy intention screening data noted above was reviewed. Potential methods of contraception were discussed. The patient elected to proceed with Vasectomy.    Obstetric History OB History  Gravida Para Term Preterm AB Living  3 2 2  0 1 2  SAB TAB Ectopic Multiple Live Births  1     0 2    # Outcome Date GA Lbr Len/2nd Weight Sex Delivery Anes PTL Lv  3 Term 07/21/18 [redacted]w[redacted]d 03:15 / 00:14 8 lb 11.3 oz (3.95 kg) M Vag-Spont EPI  LIV  2 SAB 2019          1 Term 11/24/13   8 lb 3 oz (3.714 kg) F Vag-Spont  N LIV    Past Medical History:  Diagnosis Date  . Breastfeeding (infant) Jul 20, 2014  . Headache   . Thyroid disease     Past Surgical History:  Procedure Laterality Date  . THYROIDECTOMY N/A 08/06/2014   Procedure: THYROIDECTOMY;  Surgeon: Carloyn Manner, MD;  Location: [facility] ORS;  Service: ENT;  Laterality: N/A;  . TYMPANOSTOMY TUBE PLACEMENT      Current Outpatient Medications on  File Prior to Visit  Medication Sig Dispense Refill  . levothyroxine (SYNTHROID) 175 MCG tablet TAKE 1 TABLET BY MOUTH EVERY DAY BEFORE BREAKFAST 30 tablet 2   No current facility-administered medications on file prior to visit.    Allergies  Allergen Reactions  . Latex Swelling and Rash  . Topamax [Topiramate]     Other reaction(s): Unknown    Social History:  reports that she has never smoked. She has never used smokeless tobacco. She reports that she does not drink alcohol and does not use drugs.  Family History  Problem Relation Age of Onset  . Cancer Mother        thyroid  . Diabetes Father   . Breast cancer Neg Hx   . Ovarian cancer Neg Hx   . Colon cancer Neg Hx     The following portions of the patient's history were reviewed and updated as appropriate: allergies, current medications, past family history, past medical history, past social history, past surgical history and problem list.  Review of Systems Pertinent items noted in HPI and remainder of comprehensive ROS otherwise negative.  Physical Exam:  BP 106/75   Pulse 68   Ht 5\' 6"  (1.676 m)   Wt 174 lb 6 oz (79.1 kg)   LMP 12/02/2019 (Exact Date)   Breastfeeding No  BMI 28.14 kg/m  CONSTITUTIONAL: Well-developed, well-nourished female in no acute distress.  HENT:  Normocephalic, atraumatic, External right and left ear normal. Oropharynx is clear and moist EYES: Conjunctivae and EOM are normal. Pupils are equal, round, and reactive to light. No scleral icterus.  NECK: Normal range of motion, supple, no masses.  Normal thyroid.  SKIN: Skin is warm and dry. No rash noted. Not diaphoretic. No erythema. No pallor. MUSCULOSKELETAL: Normal range of motion. No tenderness.  No cyanosis, clubbing, or edema.  2+ distal pulses. NEUROLOGIC: Alert and oriented to person, place, and time. Normal reflexes, muscle tone coordination.  PSYCHIATRIC: Normal mood and affect. Normal behavior. Normal judgment and thought  content. CARDIOVASCULAR: Normal heart rate noted, regular rhythm RESPIRATORY: Clear to auscultation bilaterally. Effort and breath sounds normal, no problems with respiration noted. BREASTS: Symmetric in size. No masses, tenderness, skin changes, nipple drainage, or lymphadenopathy bilaterally.  ABDOMEN: Soft, no distention noted.  No tenderness, rebound or guarding.  PELVIC: Normal appearing external genitalia and urethral meatus; normal appearing vaginal mucosa and cervix.  No abnormal discharge noted.  Pap smear not due.  Normal uterine size, no other palpable masses, no uterine or adnexal tenderness.  .   Assessment and Plan:    1. Women's annual routine gynecological examination    Pap:not due Mammogram : n/a Labs:TSH, declines flu and Hep C. Refills:none Referral:none Routine preventative health maintenance measures emphasized. Please refer to After Visit Summary for other counseling recommendations.      Philip Aspen, CNM [facility]

## 2019-12-13 LAB — TSH: TSH: 0.037 u[IU]/mL — ABNORMAL LOW (ref 0.450–4.500)

## 2020-03-29 DIAGNOSIS — Z7689 Persons encountering health services in other specified circumstances: Secondary | ICD-10-CM | POA: Diagnosis not present

## 2020-06-01 ENCOUNTER — Other Ambulatory Visit: Payer: Self-pay

## 2020-06-01 ENCOUNTER — Ambulatory Visit
Admission: EM | Admit: 2020-06-01 | Discharge: 2020-06-01 | Disposition: A | Discharge status: Home / Self Care | Payer: 59 | Attending: Family Medicine | Admitting: Family Medicine

## 2020-06-01 DIAGNOSIS — H66011 Acute suppurative otitis media with spontaneous rupture of ear drum, right ear: Secondary | ICD-10-CM | POA: Diagnosis not present

## 2020-06-01 MED ORDER — CEFDINIR 300 MG PO CAPS: 300.0000 mg | ORAL_CAPSULE | Freq: Two times a day (BID) | ORAL | 0 refills | Status: AC

## 2020-06-01 NOTE — Discharge Instructions (Addendum)
Take the cefdinir twice daily for 10 days with food for treatment of your ear infection.  Use over-the-counter ibuprofen according to the package instructions as needed for pain relief.  Avoid getting water in your ear so make sure that you are wearing a silicone ear plug when taking a shower or if you go swimming.  You can utilize a heating pad or hot water bottle underneath your pillowcase at night to help with pain relief as well.  Follow-up with your ENT in 2 weeks to ensure that your eardrum is healing.  May take 4 to 6 weeks to completely heal.  If you have any increase in drainage, increase in pain, dizziness, nausea vomiting, fever, or swelling or tenderness to your ear or the side of your scalp return for reevaluation or go to the ER.

## 2020-06-01 NOTE — ED Triage Notes (Signed)
Patient states that she noticed blood draining from her right ear this morning. States that the ear was hurting very bad last night. States that this happened when she was little.

## 2020-06-01 NOTE — ED Provider Notes (Signed)
[facility]    CSN: [id number] Arrival date & time: 06/01/20  0938      History   Chief Complaint Chief Complaint  Patient presents with  . Ear Drainage    HPI Kristen Powers is a 30 y.o. female.   HPI   30 year old female here for evaluation of pain and bleeding from her right ear.  Patient reports that yesterday she had a lot of pressure and pain in her head that lasted all through the night and then this morning the pressure is better but she had blood draining from her ear.  Patient states that she has had some decreased hearing in that ear, ringing and popping, and was dizzy last night that is resolved today.  Patient denies fever or URI symptoms.  Past Medical History:  Diagnosis Date  . Breastfeeding (infant) Jul 20, 2014  . Headache   . Thyroid disease     Patient Active Problem List   Diagnosis Date Noted  . Status post total thyroidectomy 08/06/2014  . Cancer of thyroid ([place]) 08/06/2014    Past Surgical History:  Procedure Laterality Date  . THYROIDECTOMY N/A 08/06/2014   Procedure: THYROIDECTOMY;  Surgeon: Carloyn Manner, MD;  Location: [facility] ORS;  Service: ENT;  Laterality: N/A;  . TYMPANOSTOMY TUBE PLACEMENT      OB History    Gravida  3   Para  2   Term  2   Preterm  0   AB  1   Living  2     SAB  1   IAB      Ectopic      Multiple  0   Live Births  2            Home Medications    Prior to Admission medications   Medication Sig Start Date End Date Taking? Authorizing Provider  cefdinir (OMNICEF) 300 MG capsule Take 1 capsule (300 mg total) by mouth 2 (two) times daily for 10 days. 06/01/20 06/11/20 Yes Margarette Canada, NP  levothyroxine (SYNTHROID) 175 MCG tablet TAKE 1 TABLET BY MOUTH EVERY DAY BEFORE BREAKFAST 09/20/18  Yes Shambley, Melody N, CNM    Family History Family History  Problem Relation Age of Onset  . Cancer Mother        thyroid  . Diabetes Father   . Breast cancer Neg Hx   . Ovarian  cancer Neg Hx   . Colon cancer Neg Hx     Social History Social History   Tobacco Use  . Smoking status: Never Smoker  . Smokeless tobacco: Never Used  Vaping Use  . Vaping Use: Never used  Substance Use Topics  . Alcohol use: No  . Drug use: No     Allergies   Latex and Topamax [topiramate]   Review of Systems Review of Systems  Constitutional: Negative for fever.  HENT: Positive for ear discharge, ear pain and tinnitus.   Respiratory: Negative for cough and shortness of breath.   Skin: Negative for color change.  Hematological: Negative.   Psychiatric/Behavioral: Negative.      Physical Exam Triage Vital Signs ED Triage Vitals  Enc Vitals Group     BP 06/01/20 0948 131/78     Pulse Rate 06/01/20 0948 79     Resp 06/01/20 0948 18     Temp 06/01/20 0948 98.2 F (36.8 C)     Temp Source 06/01/20 0948 Oral     SpO2 06/01/20 0948 100 %  Weight 06/01/20 0947 158 lb (71.7 kg)     Height --      Head Circumference --      Peak Flow --      Pain Score 06/01/20 0947 0     Pain Loc --      Pain Edu? --      Excl. in [place]? --    No data found.  Updated Vital Signs BP 131/78 (BP Location: Left Arm)   Pulse 79   Temp 98.2 F (36.8 C) (Oral)   Resp 18   Wt 158 lb (71.7 kg)   LMP 05/19/2020   SpO2 100%   Breastfeeding No   BMI 25.50 kg/m   Visual Acuity Right Eye Distance:   Left Eye Distance:   Bilateral Distance:    Right Eye Near:   Left Eye Near:    Bilateral Near:     Physical Exam Vitals and nursing note reviewed.  Constitutional:      General: She is not in acute distress.    Appearance: Normal appearance. She is normal weight.  HENT:     Head: Normocephalic and atraumatic.     Right Ear: Ear canal and external ear normal.     Left Ear: Tympanic membrane, ear canal and external ear normal.  Cardiovascular:     Rate and Rhythm: Normal rate and regular rhythm.     Pulses: Normal pulses.     Heart sounds: Normal heart sounds. No murmur  heard. No gallop.   Pulmonary:     Effort: Pulmonary effort is normal.     Breath sounds: Normal breath sounds. No wheezing, rhonchi or rales.  Skin:    General: Skin is warm and dry.     Capillary Refill: Capillary refill takes less than 2 seconds.  Neurological:     General: No focal deficit present.     Mental Status: She is alert.  Psychiatric:        Mood and Affect: Mood normal.        Behavior: Behavior normal.        Thought Content: Thought content normal.        Judgment: Judgment normal.      UC Treatments / Results  Labs (all labs ordered are listed, but only abnormal results are displayed) Labs Reviewed - No data to display  EKG   Radiology No results found.  Procedures Procedures (including critical care time)  Medications Ordered in UC Medications - No data to display  Initial Impression / Assessment and Plan / UC Course  I have reviewed the triage vital signs and the nursing notes.  Pertinent labs & imaging results that were available during my care of the patient were reviewed by me and considered in my medical decision making (see chart for details).   Patient is a very pleasant, nontoxic-appearing 30 year old female here for evaluation of right ear pain and bleeding from her right ear.  Patient states that she has a longstanding history of ear infections but has not had one since she was a child.  Last night she developed significant pain and pressure in her right ear and reports that the pain and pressure have resolved this morning but she has decreased hearing and noticed blood draining from her ear.  Physical exam reveals an erythematous and injected right tympanic membrane with a hole at the upper central aspect.  There is bloody debris in the external auditory canal.  Left tympanic membrane is pearly gray with a  normal light reflex and clear external auditory canal.  Cardiopulmonary exam is unremarkable.  Patient's physical exam is consistent with  otitis media with perforation.  Will treat with cefdinir 300 mg twice daily x10 days, have patient use over-the-counter NSAIDs and moist heat for pain, and have her follow-up with her ENT this week.  Discussed water precautions and that it could take 4 to 6 weeks for her eardrum to heal.  Final Clinical Impressions(s) / UC Diagnoses   Final diagnoses:  Non-recurrent acute suppurative otitis media of right ear with spontaneous rupture of tympanic membrane     Discharge Instructions     Take the cefdinir twice daily for 10 days with food for treatment of your ear infection.  Use over-the-counter ibuprofen according to the package instructions as needed for pain relief.  Avoid getting water in your ear so make sure that you are wearing a silicone ear plug when taking a shower or if you go swimming.  You can utilize a heating pad or hot water bottle underneath your pillowcase at night to help with pain relief as well.  Follow-up with your ENT in 2 weeks to ensure that your eardrum is healing.  May take 4 to 6 weeks to completely heal.  If you have any increase in drainage, increase in pain, dizziness, nausea vomiting, fever, or swelling or tenderness to your ear or the side of your scalp return for reevaluation or go to the ER.    ED Prescriptions    Medication Sig Dispense Auth. Provider   cefdinir (OMNICEF) 300 MG capsule Take 1 capsule (300 mg total) by mouth 2 (two) times daily for 10 days. 20 capsule Margarette Canada, NP     PDMP not reviewed this encounter.   Margarette Canada, NP 06/01/20 1022

## 2020-06-13 DIAGNOSIS — Z85850 Personal history of malignant neoplasm of thyroid: Secondary | ICD-10-CM | POA: Diagnosis not present

## 2020-06-13 DIAGNOSIS — H6981 Other specified disorders of Eustachian tube, right ear: Secondary | ICD-10-CM | POA: Diagnosis not present

## 2020-06-13 DIAGNOSIS — H7201 Central perforation of tympanic membrane, right ear: Secondary | ICD-10-CM | POA: Diagnosis not present

## 2020-12-17 ENCOUNTER — Other Ambulatory Visit: Payer: Self-pay

## 2020-12-17 ENCOUNTER — Encounter: Payer: Self-pay | Admitting: Certified Nurse Midwife

## 2020-12-17 ENCOUNTER — Ambulatory Visit (INDEPENDENT_AMBULATORY_CARE_PROVIDER_SITE_OTHER): Payer: 59 | Admitting: Certified Nurse Midwife

## 2020-12-17 VITALS — BP 122/86 | HR 90 | Ht 66.0 in | Wt 159.6 lb

## 2020-12-17 DIAGNOSIS — Z01419 Encounter for gynecological examination (general) (routine) without abnormal findings: Secondary | ICD-10-CM | POA: Diagnosis not present

## 2020-12-17 NOTE — Progress Notes (Signed)
GYNECOLOGY ANNUAL PREVENTATIVE CARE ENCOUNTER NOTE  History:     ALVILDA MCKENNA is a 30 y.o. [phone] female here for a routine annual gynecologic exam.  Current complaints: none.   Denies abnormal vaginal bleeding, discharge, pelvic pain, problems with intercourse or other gynecologic concerns.     Social Relationship: married  Living: spouse and children  Work: Emergency planning/management officer Exercise:none Smoke/Alcohol/drug use: rare alcohol use, denies smoking/drugs use  Gynecologic History Patient's last menstrual period was 10/29/2020. Contraception: vasectomy Last Pap: 06/12/2018. Results were: normal  Last mammogram: n/a.    Upstream - 12/17/20 0854       Pregnancy Intention Screening   Does the patient want to become pregnant in the next year? No    Does the patient's partner want to become pregnant in the next year? No    Would the patient like to discuss contraceptive options today? N/A            The pregnancy intention screening data noted above was reviewed. Potential methods of contraception were discussed. The patient elected to proceed with no method.   Obstetric History OB History  Gravida Para Term Preterm AB Living  3 2 2  0 1 2  SAB IAB Ectopic Multiple Live Births  1     0 2    # Outcome Date GA Lbr Len/2nd Weight Sex Delivery Anes PTL Lv  3 Term 07/21/18 [redacted]w[redacted]d 03:15 / 00:14 8 lb 11.3 oz (3.95 kg) M Vag-Spont EPI  LIV  2 SAB 2019          1 Term 11/24/13   8 lb 3 oz (3.714 kg) F Vag-Spont  N LIV    Past Medical History:  Diagnosis Date   Breastfeeding (infant) Jul 20, 2014   Headache    Thyroid disease     Past Surgical History:  Procedure Laterality Date   THYROIDECTOMY N/A 08/06/2014   Procedure: THYROIDECTOMY;  Surgeon: Carloyn Manner, MD;  Location: [facility] ORS;  Service: ENT;  Laterality: N/A;   TYMPANOSTOMY TUBE PLACEMENT      Current Outpatient Medications on File Prior to Visit  Medication Sig Dispense Refill   levothyroxine  (SYNTHROID) 175 MCG tablet TAKE 1 TABLET BY MOUTH EVERY DAY BEFORE BREAKFAST 30 tablet 2   No current facility-administered medications on file prior to visit.    Allergies  Allergen Reactions   Latex Swelling and Rash   Topamax [Topiramate]     Other reaction(s): Unknown    Social History:  reports that she has never smoked. She has never used smokeless tobacco. She reports that she does not drink alcohol and does not use drugs.  Family History  Problem Relation Age of Onset   Cancer Mother        thyroid   Diabetes Father    Breast cancer Neg Hx    Ovarian cancer Neg Hx    Colon cancer Neg Hx     The following portions of the patient's history were reviewed and updated as appropriate: allergies, current medications, past family history, past medical history, past social history, past surgical history and problem list.  Review of Systems Pertinent items noted in HPI and remainder of comprehensive ROS otherwise negative.  Physical Exam:  BP 122/86   Pulse 90   Ht 5\' 6"  (1.676 m)   Wt 159 lb 9.6 oz (72.4 kg)   LMP 10/29/2020   BMI 25.76 kg/m  CONSTITUTIONAL: Well-developed, well-nourished female in no acute distress.  HENT:  Normocephalic,  atraumatic, External right and left ear normal. Oropharynx is clear and moist EYES: Conjunctivae and EOM are normal. Pupils are equal, round, and reactive to light. No scleral icterus.  NECK: Normal range of motion, supple, no masses.  Normal thyroid.  SKIN: Skin is warm and dry. No rash noted. Not diaphoretic. No erythema. No pallor. MUSCULOSKELETAL: Normal range of motion. No tenderness.  No cyanosis, clubbing, or edema.  2+ distal pulses. NEUROLOGIC: Alert and oriented to person, place, and time. Normal reflexes, muscle tone coordination.  PSYCHIATRIC: Normal mood and affect. Normal behavior. Normal judgment and thought content. CARDIOVASCULAR: Normal heart rate noted, regular rhythm RESPIRATORY: Clear to auscultation bilaterally.  Effort and breath sounds normal, no problems with respiration noted. BREASTS: Symmetric in size. No masses, tenderness, skin changes, nipple drainage, or lymphadenopathy bilaterally.  ABDOMEN: Soft, no distention noted.  No tenderness, rebound or guarding.  PELVIC: Normal appearing external genitalia and urethral meatus; normal appearing vaginal mucosa and cervix.  No abnormal discharge noted.  Pap smear not due.  Normal uterine size, no other palpable masses, no uterine or adnexal tenderness.  .   Assessment and Plan:    1. Well woman exam with routine gynecological exam  Pap: n/a Mammogram : n/a Labs: declines today will have done with Dr.Vault  Refills: none Referral: none Routine preventative health maintenance measures emphasized. Please refer to After Visit Summary for other counseling recommendations.      Philip Aspen, CNM [facility]

## 2021-01-14 DIAGNOSIS — Z85850 Personal history of malignant neoplasm of thyroid: Secondary | ICD-10-CM | POA: Diagnosis not present

## 2021-12-23 ENCOUNTER — Encounter: Payer: 59 | Admitting: Certified Nurse Midwife

## 2022-02-10 DIAGNOSIS — Z85850 Personal history of malignant neoplasm of thyroid: Secondary | ICD-10-CM | POA: Diagnosis not present

## 2022-02-27 DIAGNOSIS — Z85850 Personal history of malignant neoplasm of thyroid: Secondary | ICD-10-CM | POA: Diagnosis not present

## 2022-02-27 DIAGNOSIS — E890 Postprocedural hypothyroidism: Secondary | ICD-10-CM | POA: Diagnosis not present

## 2022-04-06 NOTE — Progress Notes (Unsigned)
   PCP:  Joylene Igo, CNM (Inactive)   No chief complaint on file.    HPI:      Ms. Kristen Powers is a 32 y.o. [id number] whose LMP was No LMP recorded., presents today for her annual examination.  Her menses are {norm/abn:715}, lasting {number: 22536} days.  Dysmenorrhea {dysmen:716}. She {does:18564} have intermenstrual bleeding.  Sex activity: {sex active: 315163}.  Last Pap: 06/01/17 Results were: no abnormalities  Hx of STDs: {STD hx:14358}  There is no FH of breast cancer. There is no FH of ovarian cancer. The patient {does:18564} do self-breast exams.  Tobacco use: {tob:20664} Alcohol use: {Alcohol:11675} No drug use.  Exercise: {exercise:31265}  She {does:18564} get adequate calcium and Vitamin D in her diet.  Patient Active Problem List   Diagnosis Date Noted   Status post total thyroidectomy 08/06/2014   Cancer of thyroid ([place]) 08/06/2014    Past Surgical History:  Procedure Laterality Date   THYROIDECTOMY N/A 08/06/2014   Procedure: THYROIDECTOMY;  Surgeon: Carloyn Manner, MD;  Location: [facility] ORS;  Service: ENT;  Laterality: N/A;   TYMPANOSTOMY TUBE PLACEMENT      Family History  Problem Relation Age of Onset   Cancer Mother        thyroid   Diabetes Father    Breast cancer Neg Hx    Ovarian cancer Neg Hx    Colon cancer Neg Hx     Social History   Socioeconomic History   Marital status: Married    Spouse name: Not on file   Number of children: Not on file   Years of education: Not on file   Highest education level: Not on file  Occupational History   Not on file  Tobacco Use   Smoking status: Never   Smokeless tobacco: Never  Vaping Use   Vaping Use: Never used  Substance and Sexual Activity   Alcohol use: No   Drug use: No   Sexual activity: Yes    Birth control/protection: Surgical    Comment: vasectomy  Other Topics Concern   Not on file  Social History Narrative   Not on file   Social Determinants of Health    Financial Resource Strain: Not on file  Food Insecurity: Not on file  Transportation Needs: Not on file  Physical Activity: Not on file  Stress: Not on file  Social Connections: Not on file  Intimate Partner Violence: Not on file     Current Outpatient Medications:    levothyroxine (SYNTHROID) 175 MCG tablet, TAKE 1 TABLET BY MOUTH EVERY DAY BEFORE BREAKFAST, Disp: 30 tablet, Rfl: 2     ROS:  Review of Systems BREAST: No symptoms   Objective: There were no vitals taken for this visit.   OBGyn Exam  Results: No results found for this or any previous visit (from the past 24 hour(s)).  Assessment/Plan: No diagnosis found.  No orders of the defined types were placed in this encounter.            GYN counsel {counseling: 16159}     F/U  No follow-ups on file.  Jeromy Borcherding B. Rendon Howell, PA-C 04/06/2022 6:55 PM

## 2022-04-07 ENCOUNTER — Other Ambulatory Visit (HOSPITAL_COMMUNITY)
Admission: RE | Admit: 2022-04-07 | Discharge: 2022-04-07 | Disposition: A | Discharge status: Home / Self Care | Payer: 59 | Source: Ambulatory Visit | Attending: Certified Nurse Midwife | Admitting: Certified Nurse Midwife

## 2022-04-07 ENCOUNTER — Encounter: Payer: Self-pay | Admitting: Obstetrics and Gynecology

## 2022-04-07 ENCOUNTER — Ambulatory Visit (INDEPENDENT_AMBULATORY_CARE_PROVIDER_SITE_OTHER): Payer: 59 | Admitting: Obstetrics and Gynecology

## 2022-04-07 VITALS — BP 110/70 | Ht 66.0 in | Wt 187.0 lb

## 2022-04-07 DIAGNOSIS — Z01419 Encounter for gynecological examination (general) (routine) without abnormal findings: Secondary | ICD-10-CM

## 2022-04-07 DIAGNOSIS — R350 Frequency of micturition: Secondary | ICD-10-CM

## 2022-04-07 DIAGNOSIS — Z1151 Encounter for screening for human papillomavirus (HPV): Secondary | ICD-10-CM | POA: Diagnosis not present

## 2022-04-07 DIAGNOSIS — Z124 Encounter for screening for malignant neoplasm of cervix: Secondary | ICD-10-CM

## 2022-04-07 LAB — POCT URINALYSIS DIPSTICK
Bilirubin, UA: NEGATIVE
Blood, UA: NEGATIVE
Glucose, UA: NEGATIVE
Ketones, UA: NEGATIVE
Leukocytes, UA: NEGATIVE
Nitrite, UA: NEGATIVE
Protein, UA: NEGATIVE
Spec Grav, UA: 1.01 (ref 1.010–1.025)
pH, UA: 6.5 (ref 5.0–8.0)

## 2022-04-07 NOTE — Patient Instructions (Signed)
I value your feedback and you entrusting us with your care. If you get a [facility] patient survey, I would appreciate you taking the time to let us know about your experience today. Thank you! ? ? ?

## 2022-04-09 LAB — URINE CULTURE: Organism ID, Bacteria: NO GROWTH

## 2022-04-09 LAB — CYTOLOGY - PAP
Adequacy: ABSENT
Comment: NEGATIVE
Diagnosis: NEGATIVE
High risk HPV: NEGATIVE

## 2023-02-21 ENCOUNTER — Ambulatory Visit
Admission: EM | Admit: 2023-02-21 | Discharge: 2023-02-21 | Disposition: A | Discharge status: Home / Self Care | Payer: 59 | Attending: Emergency Medicine | Admitting: Emergency Medicine

## 2023-02-21 ENCOUNTER — Encounter: Payer: Self-pay | Admitting: Emergency Medicine

## 2023-02-21 DIAGNOSIS — H66012 Acute suppurative otitis media with spontaneous rupture of ear drum, left ear: Secondary | ICD-10-CM | POA: Diagnosis not present

## 2023-02-21 MED ORDER — AMOXICILLIN-POT CLAVULANATE 875-125 MG PO TABS: 1.0000 | ORAL_TABLET | Freq: Two times a day (BID) | ORAL | 0 refills | Status: AC

## 2023-02-21 NOTE — ED Provider Notes (Signed)
[facility]    CSN: [id number] Arrival date & time: 02/21/23  7829      History   Chief Complaint Chief Complaint  Patient presents with   Otalgia    left    HPI Kristen Powers is a 32 y.o. female.   HPI  32 year old female with a past medical history significant for thyroid cancer and thyroidectomy presents for evaluation of pain and drainage from the left ear that started last night.  She denies any fever, changes to hearing, ringing in her ears, or dizziness.  She has had a mild runny nose and nasal congestion but denies cough.  Past Medical History:  Diagnosis Date   Breastfeeding (infant) Jul 20, 2014   Headache    Thyroid disease     Patient Active Problem List   Diagnosis Date Noted   Status post total thyroidectomy 08/06/2014   Cancer of thyroid ([facility]) 08/06/2014    Past Surgical History:  Procedure Laterality Date   THYROIDECTOMY N/A 08/06/2014   Procedure: THYROIDECTOMY;  Surgeon: Bud Face, MD;  Location: [facility] ORS;  Service: ENT;  Laterality: N/A;   TYMPANOSTOMY TUBE PLACEMENT      OB History     Gravida  3   Para  2   Term  2   Preterm  0   AB  1   Living  2      SAB  1   IAB      Ectopic      Multiple  0   Live Births  2            Home Medications    Prior to Admission medications   Medication Sig Start Date End Date Taking? Authorizing Provider  amoxicillin-clavulanate (AUGMENTIN) 875-125 MG tablet Take 1 tablet by mouth every 12 (twelve) hours for 7 days. 02/21/23 02/28/23 Yes Becky Augusta, NP  Azelastine-Fluticasone 137-50 MCG/ACT SUSP SMARTSIG:1 Spray(s) Both Nares Twice Daily PRN 03/17/22   [provider]  levothyroxine (SYNTHROID) 137 MCG tablet Take 137 mcg by mouth every morning. 02/27/22   [provider]    Family History Family History  Problem Relation Age of Onset   Cancer Mother        thyroid   Diabetes Father    Breast cancer Neg Hx    Ovarian cancer Neg Hx     Colon cancer Neg Hx     Social History Social History   Tobacco Use   Smoking status: Never   Smokeless tobacco: Never  Vaping Use   Vaping status: Never Used  Substance Use Topics   Alcohol use: No   Drug use: No     Allergies   Latex and Topamax [topiramate]   Review of Systems Review of Systems  Constitutional:  Negative for fever.  HENT:  Positive for congestion, ear discharge, ear pain and rhinorrhea. Negative for hearing loss and tinnitus.   Respiratory:  Negative for cough.   Neurological:  Negative for dizziness.     Physical Exam Triage Vital Signs ED Triage Vitals  Encounter Vitals Group     BP      Systolic BP Percentile      Diastolic BP Percentile      Pulse      Resp      Temp      Temp src      SpO2      Weight      Height      Head Circumference  Peak Flow      Pain Score      Pain Loc      Pain Education      Exclude from Growth Chart    No data found.  Updated Vital Signs BP (!) 118/93 (BP Location: Left Arm)   Pulse 76   Temp 98.2 F (36.8 C) (Oral)   Resp 14   Ht 5\' 6"  (1.676 m)   Wt 186 lb 15.2 oz (84.8 kg)   LMP 02/21/2023 (Exact Date)   SpO2 100%   BMI 30.17 kg/m   Visual Acuity Right Eye Distance:   Left Eye Distance:   Bilateral Distance:    Right Eye Near:   Left Eye Near:    Bilateral Near:     Physical Exam Vitals and nursing note reviewed.  Constitutional:      Appearance: Normal appearance. She is not ill-appearing.  HENT:     Head: Normocephalic and atraumatic.     Right Ear: Tympanic membrane, ear canal and external ear normal. There is no impacted cerumen.     Left Ear: Ear canal and external ear normal. There is no impacted cerumen.     Ears:     Comments: Left tympanic membrane is erythematous and injected and there is bloody discharge in the external auditory canal.  Significant scarring to the tympanic membrane.  The left lower quadrant of the TM is not able to be visualized due to  patient's anatomy.  Given the bloody discharge I suspect that patient has a ruptured tympanic membrane.  Right TM is pearly gray in appearance with normal light reflex and the right EAC is clear.    Nose: Congestion and rhinorrhea present.     Comments: Mild edema of nasal mucosa with scant clear discharge in both nares.  No erythema or purulent discharge noted.    Mouth/Throat:     Mouth: Mucous membranes are moist.     Pharynx: Oropharynx is clear. No oropharyngeal exudate or posterior oropharyngeal erythema.  Cardiovascular:     Rate and Rhythm: Normal rate and regular rhythm.     Pulses: Normal pulses.     Heart sounds: Normal heart sounds. No murmur heard.    No friction rub. No gallop.  Pulmonary:     Effort: Pulmonary effort is normal.     Breath sounds: Normal breath sounds. No wheezing, rhonchi or rales.  Musculoskeletal:     Cervical back: Normal range of motion and neck supple. No tenderness.  Lymphadenopathy:     Cervical: No cervical adenopathy.  Skin:    General: Skin is warm and dry.     Capillary Refill: Capillary refill takes less than 2 seconds.     Findings: No rash.  Neurological:     General: No focal deficit present.     Mental Status: She is alert and oriented to person, place, and time.      UC Treatments / Results  Labs (all labs ordered are listed, but only abnormal results are displayed) Labs Reviewed - No data to display  EKG   Radiology No results found.  Procedures Procedures (including critical care time)  Medications Ordered in UC Medications - No data to display  Initial Impression / Assessment and Plan / UC Course  I have reviewed the triage vital signs and the nursing notes.  Pertinent labs & imaging results that were available during my care of the patient were reviewed by me and considered in my medical decision making (see chart  for details).   Patient is a pleasant, nontoxic-appearing 32 year old female presenting for  evaluation of pain and drainage from the left ear as outlined HPI above.  Her physical exam does reveal erythema and injection of the left tympanic membrane with bloody discharge in the canal suggesting a ruptured tympanic membrane.  However, I am unable to visualize a rupture though the left lower quadrant of the tympanic membrane is obscured by patient's anatomy.  I will discharge her home on Augmentin 875 twice daily for 7 days for treatment of her otitis media and have her wear a silicone ear plug when she is in the shower.  She should follow-up with her ear nose and throat doctor in 4 to 6 weeks to ensure that the ear infection has resolved and that her tympanic membrane has healed.   Final Clinical Impressions(s) / UC Diagnoses   Final diagnoses:  Non-recurrent acute suppurative otitis media of left ear with spontaneous rupture of tympanic membrane     Discharge Instructions      Take the Augmentin twice daily for 7 days with food for treatment of your ear infection.  Wear a silicone ear plug when you are in the shower to prevent water entry into your ear due to the fact that your tympanic membrane has ruptured.  You will need to follow-up with your ear nose and throat doctor in approximately 4 weeks to have your tympanic membrane reevaluated to ensure that it has healed as well as that your ear infection has resolved.  Take an over-the-counter probiotic 1 hour after each dose of antibiotic to prevent diarrhea.  Use over-the-counter Tylenol and ibuprofen as needed for pain or fever.  Place a hot water bottle, or heating pad, underneath your pillowcase at night to help dilate up your ear and aid in pain relief as well as resolution of the infection.  Return for reevaluation for any new or worsening symptoms.      ED Prescriptions     Medication Sig Dispense Auth. Provider   amoxicillin-clavulanate (AUGMENTIN) 875-125 MG tablet Take 1 tablet by mouth every 12 (twelve) hours for 7  days. 14 tablet Becky Augusta, NP      PDMP not reviewed this encounter.   Becky Augusta, NP 02/21/23 1049

## 2023-02-21 NOTE — Discharge Instructions (Addendum)
Take the Augmentin twice daily for 7 days with food for treatment of your ear infection.  Wear a silicone ear plug when you are in the shower to prevent water entry into your ear due to the fact that your tympanic membrane has ruptured.  You will need to follow-up with your ear nose and throat doctor in approximately 4 weeks to have your tympanic membrane reevaluated to ensure that it has healed as well as that your ear infection has resolved.  Take an over-the-counter probiotic 1 hour after each dose of antibiotic to prevent diarrhea.  Use over-the-counter Tylenol and ibuprofen as needed for pain or fever.  Place a hot water bottle, or heating pad, underneath your pillowcase at night to help dilate up your ear and aid in pain relief as well as resolution of the infection.  Return for reevaluation for any new or worsening symptoms.

## 2023-02-21 NOTE — ED Triage Notes (Signed)
Patient c/o left ear pain and drainage from her left ear that started last night.

## 2023-02-26 DIAGNOSIS — Z85850 Personal history of malignant neoplasm of thyroid: Secondary | ICD-10-CM | POA: Diagnosis not present

## 2023-03-02 DIAGNOSIS — H7202 Central perforation of tympanic membrane, left ear: Secondary | ICD-10-CM | POA: Diagnosis not present

## 2023-03-02 DIAGNOSIS — E890 Postprocedural hypothyroidism: Secondary | ICD-10-CM | POA: Diagnosis not present

## 2023-03-02 DIAGNOSIS — H6982 Other specified disorders of Eustachian tube, left ear: Secondary | ICD-10-CM | POA: Diagnosis not present

## 2023-03-02 DIAGNOSIS — Z85850 Personal history of malignant neoplasm of thyroid: Secondary | ICD-10-CM | POA: Diagnosis not present
